# Patient Record
Sex: Female | Born: 1980 | Race: White | Hispanic: No | Smoking: Current every day smoker
Health system: Southern US, Community
[De-identification: ages and names within clinical notes are randomized; demographics above are authoritative.]

## PROBLEM LIST (undated history)

## (undated) DIAGNOSIS — F4310 Post-traumatic stress disorder, unspecified: Secondary | ICD-10-CM

## (undated) DIAGNOSIS — M549 Dorsalgia, unspecified: Secondary | ICD-10-CM

## (undated) DIAGNOSIS — G43909 Migraine, unspecified, not intractable, without status migrainosus: Secondary | ICD-10-CM

## (undated) DIAGNOSIS — K589 Irritable bowel syndrome without diarrhea: Secondary | ICD-10-CM

## (undated) DIAGNOSIS — K219 Gastro-esophageal reflux disease without esophagitis: Secondary | ICD-10-CM

## (undated) DIAGNOSIS — G8929 Other chronic pain: Secondary | ICD-10-CM

## (undated) DIAGNOSIS — F319 Bipolar disorder, unspecified: Secondary | ICD-10-CM

## (undated) HISTORY — DX: Post-traumatic stress disorder, unspecified: F43.10

## (undated) HISTORY — PX: TUBAL LIGATION: SHX77

## (undated) HISTORY — DX: Bipolar disorder, unspecified: F31.9

## (undated) HISTORY — DX: Gastro-esophageal reflux disease without esophagitis: K21.9

## (undated) HISTORY — PX: MYRINGOTOMY WITH TUBE PLACEMENT: SHX5663

## (undated) HISTORY — DX: Migraine, unspecified, not intractable, without status migrainosus: G43.909

## (undated) HISTORY — PX: CERVICAL CONE BIOPSY: SUR198

## (undated) HISTORY — DX: Irritable bowel syndrome, unspecified: K58.9

---

## 2015-03-07 ENCOUNTER — Encounter: Payer: Self-pay | Admitting: Internal Medicine

## 2015-03-22 ENCOUNTER — Telehealth: Payer: Self-pay | Admitting: Nurse Practitioner

## 2015-03-22 ENCOUNTER — Ambulatory Visit: Payer: Self-pay | Admitting: Nurse Practitioner

## 2015-03-22 ENCOUNTER — Encounter: Payer: Self-pay | Admitting: Nurse Practitioner

## 2015-03-22 NOTE — Telephone Encounter (Signed)
PATIENT WAS A NO SHOW AND LETTER SENT  °

## 2015-03-24 NOTE — Telephone Encounter (Signed)
Noted  

## 2015-03-28 ENCOUNTER — Encounter: Payer: Self-pay | Admitting: Nurse Practitioner

## 2015-03-28 ENCOUNTER — Ambulatory Visit (INDEPENDENT_AMBULATORY_CARE_PROVIDER_SITE_OTHER): Payer: Medicaid - Out of State | Admitting: Nurse Practitioner

## 2015-03-28 ENCOUNTER — Other Ambulatory Visit: Payer: Self-pay

## 2015-03-28 ENCOUNTER — Ambulatory Visit (HOSPITAL_COMMUNITY)
Admission: RE | Admit: 2015-03-28 | Discharge: 2015-03-28 | Disposition: A | Discharge status: Home / Self Care | Payer: Medicaid - Out of State | Source: Ambulatory Visit | Attending: Nurse Practitioner | Admitting: Nurse Practitioner

## 2015-03-28 VITALS — BP 135/90 | HR 93 | Temp 97.1°F | Ht 66.0 in | Wt 162.6 lb

## 2015-03-28 DIAGNOSIS — K5900 Constipation, unspecified: Secondary | ICD-10-CM

## 2015-03-28 DIAGNOSIS — K625 Hemorrhage of anus and rectum: Secondary | ICD-10-CM | POA: Diagnosis not present

## 2015-03-28 DIAGNOSIS — R1310 Dysphagia, unspecified: Secondary | ICD-10-CM | POA: Diagnosis not present

## 2015-03-28 DIAGNOSIS — K219 Gastro-esophageal reflux disease without esophagitis: Secondary | ICD-10-CM

## 2015-03-28 DIAGNOSIS — K921 Melena: Secondary | ICD-10-CM | POA: Diagnosis not present

## 2015-03-28 HISTORY — PX: OTHER SURGICAL HISTORY: SHX169

## 2015-03-28 LAB — CBC WITH DIFFERENTIAL/PLATELET
BASOS PCT: 0 % (ref 0–1)
Basophils Absolute: 0 10*3/uL (ref 0.0–0.1)
EOS PCT: 2 % (ref 0–5)
Eosinophils Absolute: 0.2 10*3/uL (ref 0.0–0.7)
HEMATOCRIT: 34.9 % — AB (ref 36.0–46.0)
Hemoglobin: 11.9 g/dL — ABNORMAL LOW (ref 12.0–15.0)
LYMPHS PCT: 22 % (ref 12–46)
Lymphs Abs: 1.8 10*3/uL (ref 0.7–4.0)
MCH: 29.6 pg (ref 26.0–34.0)
MCHC: 34.1 g/dL (ref 30.0–36.0)
MCV: 86.8 fL (ref 78.0–100.0)
MONO ABS: 0.7 10*3/uL (ref 0.1–1.0)
MPV: 10.9 fL (ref 8.6–12.4)
Monocytes Relative: 8 % (ref 3–12)
Neutro Abs: 5.6 10*3/uL (ref 1.7–7.7)
Neutrophils Relative %: 68 % (ref 43–77)
Platelets: 210 10*3/uL (ref 150–400)
RBC: 4.02 MIL/uL (ref 3.87–5.11)
RDW: 13.6 % (ref 11.5–15.5)
WBC: 8.2 10*3/uL (ref 4.0–10.5)

## 2015-03-28 LAB — COMPREHENSIVE METABOLIC PANEL
ALT: 7 U/L (ref 6–29)
AST: 13 U/L (ref 10–30)
Albumin: 3.6 g/dL (ref 3.6–5.1)
Alkaline Phosphatase: 79 U/L (ref 33–115)
BILIRUBIN TOTAL: 0.3 mg/dL (ref 0.2–1.2)
BUN: 4 mg/dL — ABNORMAL LOW (ref 7–25)
CALCIUM: 9 mg/dL (ref 8.6–10.2)
CHLORIDE: 105 mmol/L (ref 98–110)
CO2: 25 mmol/L (ref 20–31)
Creat: 0.82 mg/dL (ref 0.50–1.10)
GLUCOSE: 78 mg/dL (ref 65–99)
Potassium: 3.5 mmol/L (ref 3.5–5.3)
Sodium: 138 mmol/L (ref 135–146)
Total Protein: 6.2 g/dL (ref 6.1–8.1)

## 2015-03-28 MED ORDER — PEG 3350-KCL-NA BICARB-NACL 420 G PO SOLR: 4000.0000 mL | Freq: Once | ORAL | Status: DC

## 2015-03-28 NOTE — Assessment & Plan Note (Signed)
Patient states she has constipation with only a bowel movement every 3-4 days. However, her stools do seem to be liquid. Possible overflow diarrhea around constipation. Today we'll I'll order an abdominal x-ray to look for excessive stool burden. Colonoscopy as noted below also allow further evaluation for etiology. Return for follow-up in 2 months.

## 2015-03-28 NOTE — Assessment & Plan Note (Signed)
Patient with rectal bleeding about once a week noted on the stool and in the toilet. Also with generalized mild abdominal pain. Last colonoscopy per the patient was in [facility] in 2009. Given rectal bleeding and a years since last colonoscopy plan to proceed with colonoscopy in addition to her needed endoscopy to further evaluate. Return for follow-up in 2 months.

## 2015-03-28 NOTE — Patient Instructions (Addendum)
1. Have your x-ray and labs done when you're able to. 2. We will schedule your procedure for you. 3. Return for follow-up in 2 months.

## 2015-03-28 NOTE — Progress Notes (Addendum)
Primary Care Physician:  Ardyth Man, MD Primary Gastroenterologist:  Dr. Jena Gauss  Chief Complaint  Patient presents with  . Rectal Bleeding  . Dysphagia    HPI:   35 year old female referred by PCP for GERD and consideration of endoscopy. PCP notes reviewed. Last saw PCP on 02/22/2015 for "feels like something is going to burst, hiccups, really bad pressure." Question need for possible endoscopy. Appears to have been started on Dexilant 60 mg a day as she was taking omeprazole previously.  Today she states she had never had GERD before 3-4 months ago, begun abruptly. Was initially started on omeprazole which helped some but she began having worsening symptoms and was switched to Dexilant which did not work any better. Carafate tabs have helped some. Hasn't had recurrence since starting Carafate twice a day. Is having dysphagia symptoms daily, has to drink lots of fluids to get food to pass. Has some generalized abdominal pain described as dull, intermittent, and is 5/10 when it happens. No improvement with bowel movement. Also with increased nausea and vomiting, last episode of emesis about 1 month ago. Has hematochezia about once a week in the stool which has been occurring "for years." Was previously seen by GI with EGD and TCS at [facility] about 2009. Told she has IBS. Has diarrhea all the time. Admits black tarry stools with about 2 out of every 5 bowel movements. Has a bowel movement every 3-4 days, loose stools, occasional straining. Also admits chest pain and dyspnea which she hasn't told her PCP about. Has been going on since ER 1 month ago for chest pain and dx of GERD/esophagitis, given GI cocktail and PCP follow-up. Denies dizziness, lightheadedness, syncope, near syncope. Denies any other upper or lower GI symptoms.   Past Medical History  Diagnosis Date  . GERD (gastroesophageal reflux disease)   . PTSD (post-traumatic stress disorder)   . IBS (irritable bowel syndrome)   .  Migraines   . Bipolar disorder (HCC)   . Chronic back pain     Past Surgical History  Procedure Laterality Date  . None to date  03/28/15  . Myringotomy with tube placement    . Cervical cone biopsy      Current Outpatient Prescriptions  Medication Sig Dispense Refill  . buPROPion (WELLBUTRIN SR) 150 MG 12 hr tablet Take by mouth daily. Take 3 tabs every morning.    . cetirizine (ZYRTEC) 10 MG tablet Take 20 mg by mouth daily.    . clonazePAM (KLONOPIN) 1 MG tablet Take 3 mg by mouth daily.    . fluticasone (FLONASE) 50 MCG/ACT nasal spray Place 1 spray into both nostrils daily.    Marland Kitchen loperamide (IMODIUM) 2 MG capsule Take 2 mg by mouth as needed for diarrhea or loose stools.    Marland Kitchen omeprazole (PRILOSEC) 20 MG capsule Take 20 mg by mouth daily.    . pramipexole (MIRAPEX) 0.5 MG tablet Take 0.5 mg by mouth at bedtime.    . promethazine (PHENERGAN) 25 MG tablet Take 25 mg by mouth 3 (three) times daily as needed for nausea or vomiting.    . propranolol (INDERAL) 60 MG tablet Take 60 mg by mouth 2 (two) times daily.    . risperiDONE (RISPERDAL) 3 MG tablet Take 6 mg by mouth at bedtime.    . rizatriptan (MAXALT) 10 MG tablet Take 10 mg by mouth as needed for migraine. May repeat in 2 hours if needed    . traZODone (DESYREL) 100 MG tablet  Take 100-200 mg by mouth at bedtime.     . trihexyphenidyl (ARTANE) 2 MG tablet Take 2 mg by mouth at bedtime.    Marland Kitchen amitriptyline (ELAVIL) 10 MG tablet Take 10 mg by mouth at bedtime.    Marland Kitchen HYDROcodone-acetaminophen (NORCO/VICODIN) 5-325 MG tablet Take 1 tablet by mouth daily as needed for moderate pain.   0  . levonorgestrel (MIRENA) 20 MCG/24HR IUD 1 each by Intrauterine route once.    . polyethylene glycol-electrolytes (NULYTELY/GOLYTELY) 420 g solution Take 4,000 mLs by mouth once. 4000 mL 0  . sucralfate (CARAFATE) 1 g tablet Take 1 g by mouth daily.  0   No current facility-administered medications for this visit.    Allergies as of 03/28/2015 -  Review Complete 03/28/2015  Allergen Reaction Noted  . Levsin [hyoscyamine] Other (See Comments) 03/28/2015    Family History  Problem Relation Age of Onset  . Colon cancer Neg Hx     Social History   Social History  . Marital Status: Legally Separated    Spouse Name: N/A  . Number of Children: N/A  . Years of Education: N/A   Occupational History  . Not on file.   Social History Main Topics  . Smoking status: Current Every Day Smoker -- 0.50 packs/day for 17 years    Types: Cigarettes  . Smokeless tobacco: Never Used  . Alcohol Use: No  . Drug Use: Yes    Special: Marijuana     Comment: last used feb 1.  . Sexual Activity: Yes    Birth Control/ Protection: IUD   Other Topics Concern  . Not on file   Social History Narrative    Review of Systems: General: Negative for anorexia, weight loss, fever, chills, fatigue, weakness. Eyes: Negative for vision changes.  ENT: Negative for hoarseness. CV: Negative for chest pain, angina, palpitations, peripheral edema.  Respiratory: Negative for dyspnea at rest, cough, sputum, wheezing.  GI: See history of present illness. Derm: Negative for rash or itching.  Endo: Negative for unusual weight change.  Heme: Negative for bruising or bleeding. Allergy: Negative for rash or hives.    Physical Exam: BP 135/90 mmHg  Pulse 93  Temp(Src) 97.1 F (36.2 C) (Oral)  Ht  (1.676 m)  Wt 162 lb 9.6 oz (73.755 kg)  BMI 26.26 kg/m2  LMP  (LMP Unknown) General:   Alert and oriented. Pleasant and cooperative. Well-nourished and well-developed.  Head:  Normocephalic and atraumatic. Eyes:  Without icterus, sclera clear and conjunctiva pink.  Ears:  Normal auditory acuity. Cardiovascular:  S1, S2 present without murmurs appreciated. Extremities without clubbing or edema. Respiratory:  Clear to auscultation bilaterally. No wheezes, rales, or rhonchi. No distress.  Gastrointestinal:  +BS, soft, and non-distended. No HSM noted. No  guarding or rebound. No masses appreciated. Minimal generalized abdominal TTP. Rectal:  Deferred  Musculoskalatal:  Symmetrical without gross deformities. Normal posture. Skin:  Intact without significant lesions or rashes. Neurologic:  Alert and oriented x4;  grossly normal neurologically. Psych:  Alert and cooperative. Normal mood and affect. Heme/Lymph/Immune: No excessive bruising noted.    04/22/2015 3:55 PM

## 2015-03-28 NOTE — Assessment & Plan Note (Signed)
Patient with daily dysphagia symptoms in addition to her GERD symptoms as noted below. No regurgitation. Last EGD in 2009 at [place]. We'll request these records. At this point we'll proceed with endoscopy in addition to her colonoscopy as noted below. Return for follow-up in 2 months.

## 2015-03-28 NOTE — Assessment & Plan Note (Addendum)
Patient with new onset GERD 3-4 months ago with an abrupt start. Omeprazole helped some but then symptoms worsened. Dexilant did not seem to work any better. Is currently taking Carafate which helps somewhat. Has been seen in the emergency room at [facility] for the same which is where she received Carafate. At this point recommend she continue Dexilant daily, Carafate 2-3 times a day as needed. We'll also proceed with endoscopy in addition to colonoscopy for rectal bleeding.  Proceed with TCS and EGD with Dr. Jena Gauss in near future: the risks, benefits, and alternatives have been discussed with the patient in detail. The patient states understanding and desires to proceed.  The patient is on trazodone, risperidone, Phenergan, Christiane Ha, and Wellbutrin. Given polypharmacy we'll plan for the procedure and the OR on propofol/MAC to promote adequate sedation.

## 2015-03-28 NOTE — Progress Notes (Signed)
CC'D TO PCP °

## 2015-04-15 NOTE — Patient Instructions (Signed)
Kristine Vaughn  04/15/2015     @   Your procedure is scheduled on  04/25/2015   Report to [facility] at  845  A.M.  Call this number if you have problems the morning of surgery:  [phone]   Remember:  Do not eat food or drink liquids after midnight.  Take these medicines the morning of surgery with A SIP OF WATER  Wellbutrin, zyrtec, klonopin, hydrocodone, prilosec, phenergan, propranolol, maxalt.   Do not wear jewelry, make-up or nail polish.  Do not wear lotions, powders, or perfumes.  You may wear deodorant.  Do not shave 48 hours prior to surgery.  Men may shave face and neck.  Do not bring valuables to the hospital.  [facility] is not responsible for any belongings or valuables.  Contacts, dentures or bridgework may not be worn into surgery.  Leave your suitcase in the car.  After surgery it may be brought to your room.  For patients admitted to the hospital, discharge time will be determined by your treatment team.  Patients discharged the day of surgery will not be allowed to drive home.   Name and phone number of your driver:   family Special instructions:  Follow the diet and prep instructions given to you by Dr Luvenia Starch office.  Please read over the following fact sheets that you were given. Coughing and Deep Breathing, Surgical Site Infection Prevention, Anesthesia Post-op Instructions and Care and Recovery After Surgery      Esophagogastroduodenoscopy Esophagogastroduodenoscopy (EGD) is a procedure that is used to examine the lining of the esophagus, stomach, and first part of the small intestine (duodenum). A long, flexible, lighted tube with a camera attached (endoscope) is inserted down the throat to view these organs. This procedure is done to detect problems or abnormalities, such as inflammation, bleeding, ulcers, or growths, in order to treat them. The procedure lasts 5-20 minutes. It is usually an outpatient  procedure, but it may need to be performed in a hospital in emergency cases. LET [facility] CARE PROVIDER KNOW ABOUT:  Any allergies you have.  All medicines you are taking, including vitamins, herbs, eye drops, creams, and over-the-counter medicines.  Previous problems you or members of your family have had with the use of anesthetics.  Any blood disorders you have.  Previous surgeries you have had.  Medical conditions you have. RISKS AND COMPLICATIONS Generally, this is a safe procedure. However, problems can occur and include:  Infection.  Bleeding.  Tearing (perforation) of the esophagus, stomach, or duodenum.  Difficulty breathing or not being able to breathe.  Excessive sweating.  Spasms of the larynx.  Slowed heartbeat.  Low blood pressure. BEFORE THE PROCEDURE  Do not eat or drink anything after midnight on the night before the procedure or as directed by your health care provider.  Do not take your regular medicines before the procedure if your health care provider asks you not to. Ask your health care provider about changing or stopping those medicines.  If you wear dentures, be prepared to remove them before the procedure.  Arrange for someone to drive you home after the procedure. PROCEDURE  A numbing medicine (local anesthetic) may be sprayed in your throat for comfort and to stop you from gagging or coughing.  You will have an IV tube inserted in a vein in your hand or arm. You will receive medicines and fluids through this tube.  You will be given a medicine to relax you (sedative).  A pain reliever will be given through the IV tube.  A mouth guard may be placed in your mouth to protect your teeth and to keep you from biting on the endoscope.  You will be asked to lie on your left side.  The endoscope will be inserted down your throat and into your esophagus, stomach, and duodenum.  Air will be put through the endoscope to allow your health  care provider to clearly view the lining of your esophagus.  The lining of your esophagus, stomach, and duodenum will be examined. During the exam, your health care provider may:  Remove tissue to be examined under a microscope (biopsy) for inflammation, infection, or other medical problems.  Remove growths.  Remove objects (foreign bodies) that are stuck.  Treat any bleeding with medicines or other devices that stop tissues from bleeding (hot cautery, clipping devices).  Widen (dilate) or stretch narrowed areas of your esophagus and stomach.  The endoscope will be withdrawn. AFTER THE PROCEDURE  You will be taken to a recovery area for observation. Your blood pressure, heart rate, breathing rate, and blood oxygen level will be monitored often until the medicines you were given have worn off.  Do not eat or drink anything until the numbing medicine has worn off and your gag reflex has returned. You may choke.  Your health care provider should be able to discuss his or her findings with you. It will take longer to discuss the test results if any biopsies were taken.   This information is not intended to replace advice given to you by your health care provider. Make sure you discuss any questions you have with your health care provider.   Document Released: 06/15/2004 Document Revised: 03/05/2014 Document Reviewed: 01/16/2012 Elsevier Interactive Patient Education 2016 [employer] Esophagogastroduodenoscopy, Care After Refer to this sheet in the next few weeks. These instructions provide you with information about caring for yourself after your procedure. Your health care provider may also give you more specific instructions. Your treatment has been planned according to current medical practices, but problems sometimes occur. Call your health care provider if you have any problems or questions after your procedure. WHAT TO EXPECT AFTER THE PROCEDURE After your procedure, it is typical  to feel:  Soreness in your throat.  Pain with swallowing.  Sick to your stomach (nauseous).  Bloated.  Dizzy.  Fatigued. HOME CARE INSTRUCTIONS  Do not eat or drink anything until the numbing medicine (local anesthetic) has worn off and your gag reflex has returned. You will know that the local anesthetic has worn off when you can swallow comfortably.  Do not drive or operate machinery until directed by your health care provider.  Take medicines only as directed by your health care provider. SEEK MEDICAL CARE IF:   You cannot stop coughing.  You are not urinating at all or less than usual. SEEK IMMEDIATE MEDICAL CARE IF:  You have difficulty swallowing.  You cannot eat or drink.  You have worsening throat or chest pain.  You have dizziness or lightheadedness or you faint.  You have nausea or vomiting.  You have chills.  You have a fever.  You have severe abdominal pain.  You have black, tarry, or bloody stools.   This information is not intended to replace advice given to you by your health care provider. Make sure you discuss any questions you have with your health care provider.  Document Released: 01/30/2012 Document Revised: 03/05/2014 Document Reviewed: 01/30/2012 Elsevier Interactive Patient Education 2016 [employer]. Colonoscopy A colonoscopy is an exam to look at the entire large intestine (colon). This exam can help find problems such as tumors, polyps, inflammation, and areas of bleeding. The exam takes about 1 hour.  LET [place] CARE PROVIDER KNOW ABOUT:   Any allergies you have.  All medicines you are taking, including vitamins, herbs, eye drops, creams, and over-the-counter medicines.  Previous problems you or members of your family have had with the use of anesthetics.  Any blood disorders you have.  Previous surgeries you have had.  Medical conditions you have. RISKS AND COMPLICATIONS  Generally, this is a safe procedure.  However, as with any procedure, complications can occur. Possible complications include:  Bleeding.  Tearing or rupture of the colon wall.  Reaction to medicines given during the exam.  Infection (rare). BEFORE THE PROCEDURE   Ask your health care provider about changing or stopping your regular medicines.  You may be prescribed an oral bowel prep. This involves drinking a large amount of medicated liquid, starting the day before your procedure. The liquid will cause you to have multiple loose stools until your stool is almost clear or light green. This cleans out your colon in preparation for the procedure.  Do not eat or drink anything else once you have started the bowel prep, unless your health care provider tells you it is safe to do so.  Arrange for someone to drive you home after the procedure. PROCEDURE   You will be given medicine to help you relax (sedative).  You will lie on your side with your knees bent.  A long, flexible tube with a light and camera on the end (colonoscope) will be inserted through the rectum and into the colon. The camera sends video back to a computer screen as it moves through the colon. The colonoscope also releases carbon dioxide gas to inflate the colon. This helps your health care provider see the area better.  During the exam, your health care provider may take a small tissue sample (biopsy) to be examined under a microscope if any abnormalities are found.  The exam is finished when the entire colon has been viewed. AFTER THE PROCEDURE   Do not drive for 24 hours after the exam.  You may have a small amount of blood in your stool.  You may pass moderate amounts of gas and have mild abdominal cramping or bloating. This is caused by the gas used to inflate your colon during the exam.  Ask when your test results will be ready and how you will get your results. Make sure you get your test results.   This information is not intended to replace  advice given to you by your health care provider. Make sure you discuss any questions you have with your health care provider.   Document Released: 02/10/2000 Document Revised: 12/03/2012 Document Reviewed: 10/20/2012 Elsevier Interactive Patient Education 2016 [employer]. Colonoscopy, Care After Refer to this sheet in the next few weeks. These instructions provide you with information on caring for yourself after your procedure. Your health care provider may also give you more specific instructions. Your treatment has been planned according to current medical practices, but problems sometimes occur. Call your health care provider if you have any problems or questions after your procedure. WHAT TO EXPECT AFTER THE PROCEDURE  After your procedure, it is typical to have the following:  A small  amount of blood in your stool.  Moderate amounts of gas and mild abdominal cramping or bloating. HOME CARE INSTRUCTIONS  Do not drive, operate machinery, or sign important documents for 24 hours.  You may shower and resume your regular physical activities, but move at a slower pace for the first 24 hours.  Take frequent rest periods for the first 24 hours.  Walk around or put a warm pack on your abdomen to help reduce abdominal cramping and bloating.  Drink enough fluids to keep your urine clear or pale yellow.  You may resume your normal diet as instructed by your health care provider. Avoid heavy or fried foods that are hard to digest.  Avoid drinking alcohol for 24 hours or as instructed by your health care provider.  Only take over-the-counter or prescription medicines as directed by your health care provider.  If a tissue sample (biopsy) was taken during your procedure:  Do not take aspirin or blood thinners for 7 days, or as instructed by your health care provider.  Do not drink alcohol for 7 days, or as instructed by your health care provider.  Eat soft foods for the first 24  hours. SEEK MEDICAL CARE IF: You have persistent spotting of blood in your stool 2-3 days after the procedure. SEEK IMMEDIATE MEDICAL CARE IF:  You have more than a small spotting of blood in your stool.  You pass large blood clots in your stool.  Your abdomen is swollen (distended).  You have nausea or vomiting.  You have a fever.  You have increasing abdominal pain that is not relieved with medicine.   This information is not intended to replace advice given to you by your health care provider. Make sure you discuss any questions you have with your health care provider.   Document Released: 09/27/2003 Document Revised: 12/03/2012 Document Reviewed: 10/20/2012 Elsevier Interactive Patient Education 2016 Elsevier Inc. PATIENT INSTRUCTIONS POST-ANESTHESIA  IMMEDIATELY FOLLOWING SURGERY:  Do not drive or operate machinery for the first twenty four hours after surgery.  Do not make any important decisions for twenty four hours after surgery or while taking narcotic pain medications or sedatives.  If you develop intractable nausea and vomiting or a severe headache please notify your doctor immediately.  FOLLOW-UP:  Please make an appointment with your surgeon as instructed. You do not need to follow up with anesthesia unless specifically instructed to do so.  WOUND CARE INSTRUCTIONS (if applicable):  Keep a dry clean dressing on the anesthesia/puncture wound site if there is drainage.  Once the wound has quit draining you may leave it open to air.  Generally you should leave the bandage intact for twenty four hours unless there is drainage.  If the epidural site drains for more than 36-48 hours please call the anesthesia department.  QUESTIONS?:  Please feel free to call your physician or the hospital operator if you have any questions, and they will be happy to assist you.

## 2015-04-19 ENCOUNTER — Encounter (HOSPITAL_COMMUNITY): Payer: Self-pay

## 2015-04-19 ENCOUNTER — Encounter (HOSPITAL_COMMUNITY)
Admission: RE | Admit: 2015-04-19 | Discharge: 2015-04-19 | Disposition: A | Discharge status: Home / Self Care | Payer: Medicaid - Out of State | Source: Ambulatory Visit | Attending: Internal Medicine | Admitting: Internal Medicine

## 2015-04-19 ENCOUNTER — Other Ambulatory Visit: Payer: Self-pay

## 2015-04-19 DIAGNOSIS — Z01810 Encounter for preprocedural cardiovascular examination: Secondary | ICD-10-CM | POA: Insufficient documentation

## 2015-04-19 DIAGNOSIS — K625 Hemorrhage of anus and rectum: Secondary | ICD-10-CM | POA: Diagnosis not present

## 2015-04-19 DIAGNOSIS — K5900 Constipation, unspecified: Secondary | ICD-10-CM | POA: Diagnosis not present

## 2015-04-19 DIAGNOSIS — R1310 Dysphagia, unspecified: Secondary | ICD-10-CM | POA: Diagnosis not present

## 2015-04-19 DIAGNOSIS — K219 Gastro-esophageal reflux disease without esophagitis: Secondary | ICD-10-CM | POA: Diagnosis not present

## 2015-04-19 HISTORY — DX: Dorsalgia, unspecified: M54.9

## 2015-04-19 HISTORY — DX: Other chronic pain: G89.29

## 2015-04-19 NOTE — Pre-Procedure Instructions (Signed)
Patient given information to sign up for my chart at home. 

## 2015-04-25 ENCOUNTER — Ambulatory Visit (HOSPITAL_COMMUNITY): Payer: Medicaid - Out of State | Admitting: Anesthesiology

## 2015-04-25 ENCOUNTER — Encounter (HOSPITAL_COMMUNITY)
Admission: RE | Disposition: A | Discharge status: Home / Self Care | Payer: Self-pay | Source: Ambulatory Visit | Attending: Internal Medicine

## 2015-04-25 ENCOUNTER — Ambulatory Visit (HOSPITAL_COMMUNITY)
Admission: RE | Admit: 2015-04-25 | Discharge: 2015-04-25 | Disposition: A | Discharge status: Home / Self Care | Payer: Medicaid - Out of State | Source: Ambulatory Visit | Attending: Internal Medicine | Admitting: Internal Medicine

## 2015-04-25 ENCOUNTER — Encounter (HOSPITAL_COMMUNITY): Payer: Self-pay | Admitting: Anesthesiology

## 2015-04-25 DIAGNOSIS — Z79899 Other long term (current) drug therapy: Secondary | ICD-10-CM | POA: Insufficient documentation

## 2015-04-25 DIAGNOSIS — K2970 Gastritis, unspecified, without bleeding: Secondary | ICD-10-CM | POA: Insufficient documentation

## 2015-04-25 DIAGNOSIS — R1310 Dysphagia, unspecified: Secondary | ICD-10-CM

## 2015-04-25 DIAGNOSIS — K921 Melena: Secondary | ICD-10-CM | POA: Diagnosis not present

## 2015-04-25 DIAGNOSIS — F4310 Post-traumatic stress disorder, unspecified: Secondary | ICD-10-CM | POA: Diagnosis not present

## 2015-04-25 DIAGNOSIS — K319 Disease of stomach and duodenum, unspecified: Secondary | ICD-10-CM | POA: Insufficient documentation

## 2015-04-25 DIAGNOSIS — K2100 Gastro-esophageal reflux disease with esophagitis, without bleeding: Secondary | ICD-10-CM | POA: Insufficient documentation

## 2015-04-25 DIAGNOSIS — R1013 Epigastric pain: Secondary | ICD-10-CM | POA: Insufficient documentation

## 2015-04-25 DIAGNOSIS — K259 Gastric ulcer, unspecified as acute or chronic, without hemorrhage or perforation: Secondary | ICD-10-CM | POA: Insufficient documentation

## 2015-04-25 DIAGNOSIS — F17210 Nicotine dependence, cigarettes, uncomplicated: Secondary | ICD-10-CM | POA: Diagnosis not present

## 2015-04-25 DIAGNOSIS — K644 Residual hemorrhoidal skin tags: Secondary | ICD-10-CM | POA: Insufficient documentation

## 2015-04-25 DIAGNOSIS — K648 Other hemorrhoids: Secondary | ICD-10-CM | POA: Diagnosis not present

## 2015-04-25 DIAGNOSIS — F319 Bipolar disorder, unspecified: Secondary | ICD-10-CM | POA: Insufficient documentation

## 2015-04-25 DIAGNOSIS — K3189 Other diseases of stomach and duodenum: Secondary | ICD-10-CM | POA: Diagnosis not present

## 2015-04-25 HISTORY — PX: COLONOSCOPY WITH PROPOFOL: SHX5780

## 2015-04-25 HISTORY — PX: ESOPHAGOGASTRODUODENOSCOPY (EGD) WITH PROPOFOL: SHX5813

## 2015-04-25 HISTORY — PX: BIOPSY: SHX5522

## 2015-04-25 HISTORY — PX: ESOPHAGEAL DILATION: SHX303

## 2015-04-25 LAB — PREGNANCY, URINE: Preg Test, Ur: NEGATIVE

## 2015-04-25 SURGERY — COLONOSCOPY WITH PROPOFOL
Anesthesia: Monitor Anesthesia Care

## 2015-04-25 MED ORDER — LIDOCAINE HCL (PF) 1 % IJ SOLN
INTRAMUSCULAR | Status: AC
  Filled 2015-04-25: qty 5

## 2015-04-25 MED ORDER — ONDANSETRON HCL 4 MG/2ML IJ SOLN
INTRAMUSCULAR | Status: AC
  Filled 2015-04-25: qty 2

## 2015-04-25 MED ORDER — FENTANYL CITRATE (PF) 100 MCG/2ML IJ SOLN: 25.0000 ug | INTRAMUSCULAR | Status: DC | PRN

## 2015-04-25 MED ORDER — MIDAZOLAM HCL 5 MG/5ML IJ SOLN
INTRAMUSCULAR | Status: DC | PRN
  Administered 2015-04-25: 2 mg via INTRAVENOUS

## 2015-04-25 MED ORDER — ONDANSETRON HCL 4 MG/2ML IJ SOLN
4.0000 mg | Freq: Once | INTRAMUSCULAR | Status: AC
  Administered 2015-04-25: 4 mg via INTRAVENOUS

## 2015-04-25 MED ORDER — PROPOFOL 10 MG/ML IV BOLUS
INTRAVENOUS | Status: AC
  Filled 2015-04-25: qty 40

## 2015-04-25 MED ORDER — LIDOCAINE HCL (CARDIAC) 10 MG/ML IV SOLN
INTRAVENOUS | Status: DC | PRN
  Administered 2015-04-25: 50 mg via INTRAVENOUS

## 2015-04-25 MED ORDER — GLYCOPYRROLATE 0.2 MG/ML IJ SOLN
INTRAMUSCULAR | Status: AC
  Filled 2015-04-25: qty 1

## 2015-04-25 MED ORDER — MIDAZOLAM HCL 2 MG/2ML IJ SOLN
INTRAMUSCULAR | Status: AC
  Filled 2015-04-25: qty 2

## 2015-04-25 MED ORDER — FENTANYL CITRATE (PF) 100 MCG/2ML IJ SOLN
INTRAMUSCULAR | Status: AC
  Filled 2015-04-25: qty 2

## 2015-04-25 MED ORDER — PROPOFOL 500 MG/50ML IV EMUL
INTRAVENOUS | Status: DC | PRN
  Administered 2015-04-25: 150 ug/kg/min via INTRAVENOUS
  Administered 2015-04-25: 10:00:00 via INTRAVENOUS

## 2015-04-25 MED ORDER — LIDOCAINE VISCOUS 2 % MT SOLN
OROMUCOSAL | Status: AC
  Filled 2015-04-25: qty 15

## 2015-04-25 MED ORDER — LACTATED RINGERS IV SOLN
INTRAVENOUS | Status: DC
  Administered 2015-04-25: 10:00:00 via INTRAVENOUS

## 2015-04-25 MED ORDER — ONDANSETRON HCL 4 MG/2ML IJ SOLN: 4.0000 mg | Freq: Once | INTRAMUSCULAR | Status: DC | PRN

## 2015-04-25 MED ORDER — MIDAZOLAM HCL 2 MG/2ML IJ SOLN
1.0000 mg | INTRAMUSCULAR | Status: DC | PRN
  Administered 2015-04-25: 2 mg via INTRAVENOUS

## 2015-04-25 MED ORDER — FENTANYL CITRATE (PF) 100 MCG/2ML IJ SOLN
25.0000 ug | INTRAMUSCULAR | Status: AC
  Administered 2015-04-25 (×2): 25 ug via INTRAVENOUS

## 2015-04-25 MED ORDER — LIDOCAINE VISCOUS 2 % MT SOLN
5.0000 mL | Freq: Once | OROMUCOSAL | Status: AC
  Administered 2015-04-25: 5 mL via OROMUCOSAL

## 2015-04-25 NOTE — Anesthesia Procedure Notes (Signed)
Procedure Name: MAC Date/Time: 04/25/2015 9:53 AM Performed by: Pernell Dupre, Greydis Stlouis A Pre-anesthesia Checklist: Patient identified, Emergency Drugs available, Suction available, Timeout performed and Patient being monitored Oxygen Delivery Method: Simple face mask

## 2015-04-25 NOTE — Op Note (Signed)
[facility]: [place] Rhonda [address] [place], [address]   ENDOSCOPY PROCEDURE REPORT  PATIENT: Kristine Vaughn, Kristine Vaughn  MR#: [id number] BIRTHDATE: 1980-04-22 , 34  yrs. old GENDER: female ENDOSCOPIST: R.  Roetta Sessions, MD FACP FACG REFERRED BY:  Meredith Mody, M.D. PROCEDURE DATE:  02-May-2015 PROCEDURE:  EGD w/ biopsy and Maloney dilation of esophagus INDICATIONS:  esophageal dysphagia; GERD; epigastric pain. MEDICATIONS: deep sedation per Dr.  [facility] ASA CLASS:      Class II  CONSENT: The risks, benefits, limitations, alternatives and imponderables have been discussed.  The potential for biopsy, esophogeal dilation, etc. have also been reviewed.  Questions have been answered.  All parties agreeable.  Please see the history and physical in the medical record for more information.  DESCRIPTION OF PROCEDURE: After the risks benefits and alternatives of the procedure were thoroughly explained, informed consent was obtained.  The EG-2990i ([id number]) endoscope was introduced through the mouth and advanced to the second portion of the duodenum , limited by Without limitations. The instrument was slowly withdrawn as the mucosa was fully examined. Estimated blood loss is zero unless otherwise noted in this procedure report.    Circumferential distal esophageal erosions 5 mm junction.  No tumor seen.  No Barrett's epithelium seen..  Tubular esophagus appeared patent throughout its course.  Stomach empty. Multiple antral erosions present. No ulcer infiltrating process. Patent pylorus. Normal-appearing first and second portion of the duodenum.  Scope withdrawn; a 14 French Maloney dilator was passed to full insertion easily. A look back revealed no apparent complication related to this maneuver. Finally, biopsies of abnormal gastric mucosa taken. Retroflexed views revealed as previously described. The scope was then withdrawn from the patient and the  procedure completed.  COMPLICATIONS: There were no immediate complications. EBL 3 mL ENDOSCOPIC IMPRESSION: Erosive reflux esophagitis. Abnormal gastric mucosa?"status post biopsy  RECOMMENDATIONS: Further recommendations to follow pending review of pathology report. See colonoscopy report.  REPEAT EXAM:  eSigned:  R. Roetta Sessions, MD Jerrel Ivory [facility] May 02, 2015 10:19 AM    CC:  CPT CODES: ICD CODES:  The ICD and CPT codes recommended by this software are interpretations from the data that the clinical staff has captured with the software.  The verification of the translation of this report to the ICD and CPT codes and modifiers is the sole responsibility of the health care institution and practicing physician where this report was generated.  [employer] will not be held responsible for the validity of the ICD and CPT codes included on this report.  AMA assumes no liability for data contained or not contained herein. CPT is a Publishing rights manager of the [employer].  PATIENT NAME:  [name], [name] MR#: [id number]

## 2015-04-25 NOTE — Op Note (Signed)
[facility] [address] [place], [address]   COLONOSCOPY PROCEDURE REPORT  PATIENT: Kristine Vaughn, Kristine Vaughn  MR#: [id number] BIRTHDATE: 12/12/1980 , 34  yrs. old GENDER: female ENDOSCOPIST: R.  Roetta Sessions, MD FACP [facility] REFERRED XB:JYNW Park, M.D. PROCEDURE DATE:  05-20-15 PROCEDURE:   Ileo-colonoscopy, diagnostic INDICATIONS:hematochezia. MEDICATIONS: deep sedation per Dr.  [facility] ASA CLASS:       Class II  CONSENT: The risks, benefits, alternatives and imponderables including but not limited to bleeding, perforation as well as the possibility of a missed lesion have been reviewed.  The potential for biopsy, lesion removal, etc. have also been discussed. Questions have been answered.  All parties agreeable.  Please see the history and physical in the medical record for more information.  DESCRIPTION OF PROCEDURE:   After the risks benefits and alternatives of the procedure were thoroughly explained, informed consent was obtained.  The digital rectal exam revealed no abnormalities of the rectum.   The EG-2990i ([id number])  endoscope was introduced through the anus and advanced to the terminal ileum which was intubated for a short distance. No adverse events experienced.   The quality of the prep was adequate  The instrument was then slowly withdrawn as the colon was fully examined. Estimated blood loss is zero unless otherwise noted in this procedure report.      COLON FINDINGS: External hemorrhoid tags; normal-appearing rectal mucosa.  Normal-appearing colonic mucosa.  The distal 5 cm of terminal ileal mucosa also appeared normal.  Retroflexion was performed. .  Withdrawal time=7 minutes 0 seconds.  The scope was withdrawn and the procedure completed. COMPLICATIONS: There were no immediate complications.  ENDOSCOPIC IMPRESSION: External hemorrhoids; otherwise normal ileo-colonoscopy  RECOMMENDATIONS: Ten-day course of Anusol's  suppositories 1 per rectum twice daily. See EGD report.  eSigned:  R. Roetta Sessions, MD Jerrel Ivory [facility] 20-May-2015 10:58 AM   cc:  CPT CODES: ICD CODES:  The ICD and CPT codes recommended by this software are interpretations from the data that the clinical staff has captured with the software.  The verification of the translation of this report to the ICD and CPT codes and modifiers is the sole responsibility of the health care institution and practicing physician where this report was generated.  [employer] will not be held responsible for the validity of the ICD and CPT codes included on this report.  AMA assumes no liability for data contained or not contained herein. CPT is a Publishing rights manager of the [employer].  PATIENT NAME:  Kristine Vaughn, Kristine Vaughn MR#: [id number]

## 2015-04-25 NOTE — Transfer of Care (Signed)
Immediate Anesthesia Transfer of Care Note  Patient: Kristine Vaughn  Procedure(s) Performed: Procedure(s) with comments: COLONOSCOPY WITH PROPOFOL (N/A) - 0915 - moved to 10:00 - office to notify ESOPHAGOGASTRODUODENOSCOPY (EGD) WITH PROPOFOL (N/A) BIOPSY ESOPHAGEAL DILATION  Patient Location: PACU  Anesthesia Type:MAC  Level of Consciousness: awake, alert , oriented and patient cooperative  Airway & Oxygen Therapy: Patient Spontanous Breathing and Patient connected to face mask oxygen  Post-op Assessment: Report given to RN and Post -op Vital signs reviewed and stable  Post vital signs: Reviewed and stable  Last Vitals:  Filed Vitals:   04/25/15 0945 04/25/15 0950  BP: 92/58 93/57  Pulse: 80 80  Temp:    Resp: 18 18    Complications: No apparent anesthesia complications

## 2015-04-25 NOTE — Discharge Instructions (Signed)
Colonoscopy Discharge Instructions  Read the instructions outlined below and refer to this sheet in the next few weeks. These discharge instructions provide you with general information on caring for yourself after you leave the hospital. Your doctor may also give you specific instructions. While your treatment has been planned according to the most current medical practices available, unavoidable complications occasionally occur. If you have any problems or questions after discharge, call Dr. Gala Romney at [phone]. ACTIVITY  You may resume your regular activity, but move at a slower pace for the next 24 hours.   Take frequent rest periods for the next 24 hours.   Walking will help get rid of the air and reduce the bloated feeling in your belly (abdomen).   No driving for 24 hours (because of the medicine (anesthesia) used during the test).    Do not sign any important legal documents or operate any machinery for 24 hours (because of the anesthesia used during the test).  NUTRITION  Drink plenty of fluids.   You may resume your normal diet as instructed by your doctor.   Begin with a light meal and progress to your normal diet. Heavy or fried foods are harder to digest and may make you feel sick to your stomach (nauseated).   Avoid alcoholic beverages for 24 hours or as instructed.  MEDICATIONS  You may resume your normal medications unless your doctor tells you otherwise.  WHAT YOU CAN EXPECT TODAY  Some feelings of bloating in the abdomen.   Passage of more gas than usual.   Spotting of blood in your stool or on the toilet paper.  IF YOU HAD POLYPS REMOVED DURING THE COLONOSCOPY:  No aspirin products for 7 days or as instructed.   No alcohol for 7 days or as instructed.   Eat a soft diet for the next 24 hours.  FINDING OUT THE RESULTS OF YOUR TEST Not all test results are available during your visit. If your test results are not back during the visit, make an appointment  with your caregiver to find out the results. Do not assume everything is normal if you have not heard from your caregiver or the medical facility. It is important for you to follow up on all of your test results.  SEEK IMMEDIATE MEDICAL ATTENTION IF:  You have more than a spotting of blood in your stool.   Your belly is swollen (abdominal distention).   You are nauseated or vomiting.   You have a temperature over 101.  You have abdominal pain or discomfort that is severe or gets worse throughout the day. EGD Discharge instructions Please read the instructions outlined below and refer to this sheet in the next few weeks. These discharge instructions provide you with general information on caring for yourself after you leave the hospital. Your doctor may also give you specific instructions. While your treatment has been planned according to the most current medical practices available, unavoidable complications occasionally occur. If you have any problems or questions after discharge, please call your doctor. ACTIVITY You may resume your regular activity but move at a slower pace for the next 24 hours.  Take frequent rest periods for the next 24 hours.  Walking will help expel (get rid of) the air and reduce the bloated feeling in your abdomen.  No driving for 24 hours (because of the anesthesia (medicine) used during the test).  You may shower.  Do not sign any important legal documents or operate any machinery for 24  hours (because of the anesthesia used during the test).  NUTRITION Drink plenty of fluids.  You may resume your normal diet.  Begin with a light meal and progress to your normal diet.  Avoid alcoholic beverages for 24 hours or as instructed by your caregiver.  MEDICATIONS You may resume your normal medications unless your caregiver tells you otherwise.  WHAT YOU CAN EXPECT TODAY You may experience abdominal discomfort such as a feeling of fullness or gas pains.   FOLLOW-UP Your doctor will discuss the results of your test with you.  SEEK IMMEDIATE MEDICAL ATTENTION IF ANY OF THE FOLLOWING OCCUR: Excessive nausea (feeling sick to your stomach) and/or vomiting.  Severe abdominal pain and distention (swelling).  Trouble swallowing.  Temperature over 101 F (37.8 C).     Rectal bleeding or vomiting of blood.     GERD information and hemorrhoid information provided  Continue Dexilant 60 mg daily  Continue Carafate  Course of Anusol suppositories for hemorrhoids  Further recommendations to follow pending review of pathology report  Hemorrhoids Hemorrhoids are swollen veins around the rectum or anus. There are two types of hemorrhoids:   Internal hemorrhoids. These occur in the veins just inside the rectum. They may poke through to the outside and become irritated and painful.  External hemorrhoids. These occur in the veins outside the anus and can be felt as a painful swelling or hard lump near the anus. CAUSES  Pregnancy.   Obesity.   Constipation or diarrhea.   Straining to have a bowel movement.   Sitting for long periods on the toilet.  Heavy lifting or other activity that caused you to strain.  Anal intercourse. SYMPTOMS   Pain.   Anal itching or irritation.   Rectal bleeding.   Fecal leakage.   Anal swelling.   One or more lumps around the anus.  DIAGNOSIS  Your caregiver may be able to diagnose hemorrhoids by visual examination. Other examinations or tests that may be performed include:   Examination of the rectal area with a gloved hand (digital rectal exam).   Examination of anal canal using a small tube (scope).   A blood test if you have lost a significant amount of blood.  A test to look inside the colon (sigmoidoscopy or colonoscopy). TREATMENT Most hemorrhoids can be treated at home. However, if symptoms do not seem to be getting better or if you have a lot of rectal bleeding, your  caregiver may perform a procedure to help make the hemorrhoids get smaller or remove them completely. Possible treatments include:   Placing a rubber band at the base of the hemorrhoid to cut off the circulation (rubber band ligation).   Injecting a chemical to shrink the hemorrhoid (sclerotherapy).   Using a tool to burn the hemorrhoid (infrared light therapy).   Surgically removing the hemorrhoid (hemorrhoidectomy).   Stapling the hemorrhoid to block blood flow to the tissue (hemorrhoid stapling).  HOME CARE INSTRUCTIONS   Eat foods with fiber, such as whole grains, beans, nuts, fruits, and vegetables. Ask your doctor about taking products with added fiber in them (fibersupplements).  Increase fluid intake. Drink enough water and fluids to keep your urine clear or pale yellow.   Exercise regularly.   Go to the bathroom when you have the urge to have a bowel movement. Do not wait.   Avoid straining to have bowel movements.   Keep the anal area dry and clean. Use wet toilet paper or moist towelettes after a bowel  movement.   Medicated creams and suppositories may be used or applied as directed.   Only take over-the-counter or prescription medicines as directed by your caregiver.   Take warm sitz baths for 15-20 minutes, 3-4 times a day to ease pain and discomfort.   Place ice packs on the hemorrhoids if they are tender and swollen. Using ice packs between sitz baths may be helpful.   Put ice in a plastic bag.   Place a towel between your skin and the bag.   Leave the ice on for 15-20 minutes, 3-4 times a day.   Do not use a donut-shaped pillow or sit on the toilet for long periods. This increases blood pooling and pain.  SEEK MEDICAL CARE IF:  You have increasing pain and swelling that is not controlled by treatment or medicine.  You have uncontrolled bleeding.  You have difficulty or you are unable to have a bowel movement.  You have pain or  inflammation outside the area of the hemorrhoids. MAKE SURE YOU:  Understand these instructions.  Will watch your condition.  Will get help right away if you are not doing well or get worse.   This information is not intended to replace advice given to you by your health care provider. Make sure you discuss any questions you have with your health care provider.   Document Released: 02/10/2000 Document Revised: 01/30/2012 Document Reviewed: 12/18/2011 Elsevier Interactive Patient Education 2016 [employer] Inc[employer]   Gastroesophageal Reflux Disease, Adult Normally, food travels down the esophagus and stays in the stomach to be digested. However, when a person has gastroesophageal reflux disease (GERD), food and stomach acid move back up into the esophagus. When this happens, the esophagus becomes sore and inflamed. Over time, GERD can create small holes (ulcers) in the lining of the esophagus.  CAUSES This condition is caused by a problem with the muscle between the esophagus and the stomach (lower esophageal sphincter, or LES). Normally, the LES muscle closes after food passes through the esophagus to the stomach. When the LES is weakened or abnormal, it does not close properly, and that allows food and stomach acid to go back up into the esophagus. The LES can be weakened by certain dietary substances, medicines, and medical conditions, including:  Tobacco use.  Pregnancy.  Having a hiatal hernia.  Heavy alcohol use.  Certain foods and beverages, such as coffee, chocolate, onions, and peppermint. RISK FACTORS This condition is more likely to develop in:  People who have an increased body weight.  People who have connective tissue disorders.  People who use NSAID medicines. SYMPTOMS Symptoms of this condition include:  Heartburn.  Difficult or painful swallowing.  The feeling of having a lump in the throat.  Abitter taste in the mouth.  Bad breath.  Having a large amount  of saliva.  Having an upset or bloated stomach.  Belching.  Chest pain.  Shortness of breath or wheezing.  Ongoing (chronic) cough or a night-time cough.  Wearing away of tooth enamel.  Weight loss. Different conditions can cause chest pain. Make sure to see your health care provider if you experience chest pain. DIAGNOSIS Your health care provider will take a medical history and perform a physical exam. To determine if you have mild or severe GERD, your health care provider may also monitor how you respond to treatment. You may also have other tests, including:  An endoscopy toexamine your stomach and esophagus with a small camera.  A test thatmeasures the acidity  level in your esophagus.  A test thatmeasures how much pressure is on your esophagus.  A barium swallow or modified barium swallow to show the shape, size, and functioning of your esophagus. TREATMENT The goal of treatment is to help relieve your symptoms and to prevent complications. Treatment for this condition may vary depending on how severe your symptoms are. Your health care provider may recommend:  Changes to your diet.  Medicine.  Surgery. HOME CARE INSTRUCTIONS Diet  Follow a diet as recommended by your health care provider. This may involve avoiding foods and drinks such as:  Coffee and tea (with or without caffeine).  Drinks that containalcohol.  Energy drinks and sports drinks.  Carbonated drinks or sodas.  Chocolate and cocoa.  Peppermint and mint flavorings.  Garlic and onions.  Horseradish.  Spicy and acidic foods, including peppers, chili powder, curry powder, vinegar, hot sauces, and barbecue sauce.  Citrus fruit juices and citrus fruits, such as oranges, lemons, and limes.  Tomato-based foods, such as red sauce, chili, salsa, and pizza with red sauce.  Fried and fatty foods, such as donuts, french fries, potato chips, and high-fat dressings.  High-fat meats, such as hot  dogs and fatty cuts of red and white meats, such as rib eye steak, sausage, ham, and bacon.  High-fat dairy items, such as whole milk, butter, and cream cheese.  Eat small, frequent meals instead of large meals.  Avoid drinking large amounts of liquid with your meals.  Avoid eating meals during the 2-3 hours before bedtime.  Avoid lying down right after you eat.  Do not exercise right after you eat. General Instructions  Pay attention to any changes in your symptoms.  Take over-the-counter and prescription medicines only as told by your health care provider. Do not take aspirin, ibuprofen, or other NSAIDs unless your health care provider told you to do so.  Do not use any tobacco products, including cigarettes, chewing tobacco, and e-cigarettes. If you need help quitting, ask your health care provider.  Wear loose-fitting clothing. Do not wear anything tight around your waist that causes pressure on your abdomen.  Raise (elevate) the head of your bed 6 inches (15cm).  Try to reduce your stress, such as with yoga or meditation. If you need help reducing stress, ask your health care provider.  If you are overweight, reduce your weight to an amount that is healthy for you. Ask your health care provider for guidance about a safe weight loss goal.  Keep all follow-up visits as told by your health care provider. This is important. SEEK MEDICAL CARE IF:  You have new symptoms.  You have unexplained weight loss.  You have difficulty swallowing, or it hurts to swallow.  You have wheezing or a persistent cough.  Your symptoms do not improve with treatment.  You have a hoarse voice. SEEK IMMEDIATE MEDICAL CARE IF:  You have pain in your arms, neck, jaw, teeth, or back.  You feel sweaty, dizzy, or light-headed.  You have chest pain or shortness of breath.  You vomit and your vomit looks like blood or coffee grounds.  You faint.  Your stool is bloody or black.  You  cannot swallow, drink, or eat.   This information is not intended to replace advice given to you by your health care provider. Make sure you discuss any questions you have with your health care provider.   Document Released: 11/22/2004 Document Revised: 11/03/2014 Document Reviewed: 06/09/2014 Elsevier Interactive Patient Education [employer]

## 2015-04-25 NOTE — Anesthesia Postprocedure Evaluation (Signed)
Anesthesia Post Note  Patient: Kristine Vaughn  Procedure(s) Performed: Procedure(s) (LRB): COLONOSCOPY WITH PROPOFOL (N/A) ESOPHAGOGASTRODUODENOSCOPY (EGD) WITH PROPOFOL (N/A) BIOPSY ESOPHAGEAL DILATION  Patient location during evaluation: PACU Anesthesia Type: MAC Level of consciousness: awake and alert and oriented Pain management: pain level controlled Vital Signs Assessment: post-procedure vital signs reviewed and stable Respiratory status: spontaneous breathing and patient connected to face mask oxygen Cardiovascular status: stable Postop Assessment: no signs of nausea or vomiting Anesthetic complications: no    Last Vitals:  Filed Vitals:   04/25/15 0945 04/25/15 0950  BP: 92/58 93/57  Pulse: 80 80  Temp:    Resp: 18 18    Last Pain: There were no vitals filed for this visit.               ADAMS, AMY A

## 2015-04-25 NOTE — Interval H&P Note (Signed)
History and Physical Interval Note:  04/25/2015 9:32 AM  Kristine Vaughn  has presented today for surgery, with the diagnosis of GERD, dypsphagia, hematochezia, constipation  The various methods of treatment have been discussed with the patient and family. After consideration of risks, benefits and other options for treatment, the patient has consented to  Procedure(s) with comments: COLONOSCOPY WITH PROPOFOL (N/A) - 0915 - moved to 10:00 - office to notify ESOPHAGOGASTRODUODENOSCOPY (EGD) WITH PROPOFOL (N/A) as a surgical intervention .  The patient's history has been reviewed, patient examined, no change in status, stable for surgery.  I have reviewed the patient's chart and labs.  Questions were answered to the patient's satisfaction.     Kristine Vaughn  No change. EGD colonoscopy today per plan. The risks, benefits, limitations, imponderables and alternatives regarding both EGD and colonoscopy have been reviewed with the patient. Questions have been answered. All parties agreeable.

## 2015-04-25 NOTE — Anesthesia Preprocedure Evaluation (Addendum)
Anesthesia Evaluation  Patient identified by MRN, date of birth, ID band Patient awake    Reviewed: Allergy & Precautions, NPO status , Patient's Chart, lab work & pertinent test results  History of Anesthesia Complications Negative for: history of anesthetic complications  Airway Mallampati: I  TM Distance: >3 FB Neck ROM: Full    Dental  (+) Dental Advisory Given, Edentulous Upper, Edentulous Lower   Pulmonary Current Smoker,    Pulmonary exam normal        Cardiovascular negative cardio ROS Normal cardiovascular exam     Neuro/Psych PSYCHIATRIC DISORDERS Bipolar Disorder negative neurological ROS     GI/Hepatic Neg liver ROS, GERD  ,  Endo/Other  negative endocrine ROS  Renal/GU negative Renal ROS  negative genitourinary   Musculoskeletal negative musculoskeletal ROS (+)   Abdominal   Peds negative pediatric ROS (+)  Hematology negative hematology ROS (+)   Anesthesia Other Findings   Reproductive/Obstetrics negative OB ROS                            Anesthesia Physical Anesthesia Plan  ASA: III  Anesthesia Plan: MAC   Post-op Pain Management:    Induction: Intravenous  Airway Management Planned: Simple Face Mask  Additional Equipment:   Intra-op Plan:   Post-operative Plan:   Informed Consent: I have reviewed the patients History and Physical, chart, labs and discussed the procedure including the risks, benefits and alternatives for the proposed anesthesia with the patient or authorized representative who has indicated his/her understanding and acceptance.   Dental advisory given  Plan Discussed with: CRNA, Anesthesiologist and Surgeon  Anesthesia Plan Comments:         Anesthesia Quick Evaluation

## 2015-04-25 NOTE — H&P (View-Only) (Signed)
Primary Care Physician:  Ardyth Man, MD Primary Gastroenterologist:  Dr. Jena Gauss  Chief Complaint  Patient presents with  . Rectal Bleeding  . Dysphagia    HPI:   35 year old female referred by PCP for GERD and consideration of endoscopy. PCP notes reviewed. Last saw PCP on 02/22/2015 for "feels like something is going to burst, hiccups, really bad pressure." Question need for possible endoscopy. Appears to have been started on Dexilant 60 mg a day as she was taking omeprazole previously.  Today she states she had never had GERD before 3-4 months ago, begun abruptly. Was initially started on omeprazole which helped some but she began having worsening symptoms and was switched to Dexilant which did not work any better. Carafate tabs have helped some. Hasn't had recurrence since starting Carafate twice a day. Is having dysphagia symptoms daily, has to drink lots of fluids to get food to pass. Has some generalized abdominal pain described as dull, intermittent, and is 5/10 when it happens. No improvement with bowel movement. Also with increased nausea and vomiting, last episode of emesis about 1 month ago. Has hematochezia about once a week in the stool which has been occurring "for years." Was previously seen by GI with EGD and TCS at [facility] about 2009. Told she has IBS. Has diarrhea all the time. Admits black tarry stools with about 2 out of every 5 bowel movements. Has a bowel movement every 3-4 days, loose stools, occasional straining. Also admits chest pain and dyspnea which she hasn't told her PCP about. Has been going on since ER 1 month ago for chest pain and dx of GERD/esophagitis, given GI cocktail and PCP follow-up. Denies dizziness, lightheadedness, syncope, near syncope. Denies any other upper or lower GI symptoms.   Past Medical History  Diagnosis Date  . GERD (gastroesophageal reflux disease)   . PTSD (post-traumatic stress disorder)   . IBS (irritable bowel syndrome)   .  Migraines   . Bipolar disorder (HCC)   . Chronic back pain     Past Surgical History  Procedure Laterality Date  . None to date  03/28/15  . Myringotomy with tube placement    . Cervical cone biopsy      Current Outpatient Prescriptions  Medication Sig Dispense Refill  . buPROPion (WELLBUTRIN SR) 150 MG 12 hr tablet Take by mouth daily. Take 3 tabs every morning.    . cetirizine (ZYRTEC) 10 MG tablet Take 20 mg by mouth daily.    . clonazePAM (KLONOPIN) 1 MG tablet Take 3 mg by mouth daily.    . fluticasone (FLONASE) 50 MCG/ACT nasal spray Place 1 spray into both nostrils daily.    Marland Kitchen loperamide (IMODIUM) 2 MG capsule Take 2 mg by mouth as needed for diarrhea or loose stools.    Marland Kitchen omeprazole (PRILOSEC) 20 MG capsule Take 20 mg by mouth daily.    . pramipexole (MIRAPEX) 0.5 MG tablet Take 0.5 mg by mouth at bedtime.    . promethazine (PHENERGAN) 25 MG tablet Take 25 mg by mouth 3 (three) times daily as needed for nausea or vomiting.    . propranolol (INDERAL) 60 MG tablet Take 60 mg by mouth 2 (two) times daily.    . risperiDONE (RISPERDAL) 3 MG tablet Take 6 mg by mouth at bedtime.    . rizatriptan (MAXALT) 10 MG tablet Take 10 mg by mouth as needed for migraine. May repeat in 2 hours if needed    . traZODone (DESYREL) 100 MG tablet  Take 100-200 mg by mouth at bedtime.     . trihexyphenidyl (ARTANE) 2 MG tablet Take 2 mg by mouth at bedtime.    Marland Kitchen amitriptyline (ELAVIL) 10 MG tablet Take 10 mg by mouth at bedtime.    Marland Kitchen HYDROcodone-acetaminophen (NORCO/VICODIN) 5-325 MG tablet Take 1 tablet by mouth daily as needed for moderate pain.   0  . levonorgestrel (MIRENA) 20 MCG/24HR IUD 1 each by Intrauterine route once.    . polyethylene glycol-electrolytes (NULYTELY/GOLYTELY) 420 g solution Take 4,000 mLs by mouth once. 4000 mL 0  . sucralfate (CARAFATE) 1 g tablet Take 1 g by mouth daily.  0   No current facility-administered medications for this visit.    Allergies as of 03/28/2015 -  Review Complete 03/28/2015  Allergen Reaction Noted  . Levsin [hyoscyamine] Other (See Comments) 03/28/2015    Family History  Problem Relation Age of Onset  . Colon cancer Neg Hx     Social History   Social History  . Marital Status: Legally Separated    Spouse Name: N/A  . Number of Children: N/A  . Years of Education: N/A   Occupational History  . Not on file.   Social History Main Topics  . Smoking status: Current Every Day Smoker -- 0.50 packs/day for 17 years    Types: Cigarettes  . Smokeless tobacco: Never Used  . Alcohol Use: No  . Drug Use: Yes    Special: Marijuana     Comment: last used feb 1.  . Sexual Activity: Yes    Birth Control/ Protection: IUD   Other Topics Concern  . Not on file   Social History Narrative    Review of Systems: General: Negative for anorexia, weight loss, fever, chills, fatigue, weakness. Eyes: Negative for vision changes.  ENT: Negative for hoarseness. CV: Negative for chest pain, angina, palpitations, peripheral edema.  Respiratory: Negative for dyspnea at rest, cough, sputum, wheezing.  GI: See history of present illness. Derm: Negative for rash or itching.  Endo: Negative for unusual weight change.  Heme: Negative for bruising or bleeding. Allergy: Negative for rash or hives.    Physical Exam: BP 135/90 mmHg  Pulse 93  Temp(Src) 97.1 F (36.2 C) (Oral)  Ht  (1.676 m)  Wt 162 lb 9.6 oz (73.755 kg)  BMI 26.26 kg/m2  LMP  (LMP Unknown) General:   Alert and oriented. Pleasant and cooperative. Well-nourished and well-developed.  Head:  Normocephalic and atraumatic. Eyes:  Without icterus, sclera clear and conjunctiva pink.  Ears:  Normal auditory acuity. Cardiovascular:  S1, S2 present without murmurs appreciated. Extremities without clubbing or edema. Respiratory:  Clear to auscultation bilaterally. No wheezes, rales, or rhonchi. No distress.  Gastrointestinal:  +BS, soft, and non-distended. No HSM noted. No  guarding or rebound. No masses appreciated. Minimal generalized abdominal TTP. Rectal:  Deferred  Musculoskalatal:  Symmetrical without gross deformities. Normal posture. Skin:  Intact without significant lesions or rashes. Neurologic:  Alert and oriented x4;  grossly normal neurologically. Psych:  Alert and cooperative. Normal mood and affect. Heme/Lymph/Immune: No excessive bruising noted.    04/22/2015 3:55 PM

## 2015-04-27 ENCOUNTER — Encounter (HOSPITAL_COMMUNITY): Payer: Self-pay | Admitting: Internal Medicine

## 2015-04-28 ENCOUNTER — Encounter: Payer: Self-pay | Admitting: Internal Medicine

## 2015-05-04 ENCOUNTER — Other Ambulatory Visit: Payer: Self-pay

## 2015-05-05 ENCOUNTER — Telehealth: Payer: Self-pay

## 2015-05-05 NOTE — Telephone Encounter (Signed)
Pt called to see if we could please send in her refill for her. Please advise

## 2015-05-06 MED ORDER — DEXLANSOPRAZOLE 60 MG PO CPDR: 60.0000 mg | DELAYED_RELEASE_CAPSULE | Freq: Every day | ORAL | Status: DC

## 2015-05-09 NOTE — Telephone Encounter (Signed)
RX done 3/10

## 2015-05-13 ENCOUNTER — Telehealth: Payer: Self-pay

## 2015-05-13 MED ORDER — PANTOPRAZOLE SODIUM 40 MG PO TBEC: 40.0000 mg | DELAYED_RELEASE_TABLET | Freq: Every day | ORAL | Status: DC

## 2015-05-13 NOTE — Telephone Encounter (Signed)
Pt is aware.  

## 2015-05-13 NOTE — Telephone Encounter (Signed)
Received fax from the pharmacy, pts insurance wont pay for dexilant until she has tried and failed 4 generic ppi's: omeprazole, lansoprazole, pantoprazole, and rabeprazole. Pt has only tried and failed omeprazole. She is willing to try the others. She is aware that she will need to try them for at least 30 days and will call me if its not working so we can send in another one for her to try.   [facility].

## 2015-05-13 NOTE — Telephone Encounter (Signed)
Will trial each for 30 days, starting with pantoprazole. Notify patient that new Rx sent to her pharmacy for 30 days and 0 refills. Please call in 30 days with progress report.

## 2015-05-24 ENCOUNTER — Telehealth: Payer: Self-pay | Admitting: Nurse Practitioner

## 2015-05-24 ENCOUNTER — Ambulatory Visit: Payer: Medicaid - Out of State | Admitting: Nurse Practitioner

## 2015-05-24 ENCOUNTER — Encounter: Payer: Self-pay | Admitting: Nurse Practitioner

## 2015-05-24 NOTE — Telephone Encounter (Signed)
Noted  

## 2015-05-24 NOTE — Telephone Encounter (Signed)
PATIENT WAS A NO SHOW AND LETTER SENT  °

## 2015-06-23 ENCOUNTER — Telehealth: Payer: Self-pay

## 2015-06-23 ENCOUNTER — Ambulatory Visit (INDEPENDENT_AMBULATORY_CARE_PROVIDER_SITE_OTHER): Payer: Medicaid - Out of State | Admitting: Gastroenterology

## 2015-06-23 ENCOUNTER — Encounter: Payer: Self-pay | Admitting: Gastroenterology

## 2015-06-23 VITALS — BP 126/90 | HR 110 | Temp 97.5°F | Ht 66.0 in | Wt 161.0 lb

## 2015-06-23 DIAGNOSIS — K2100 Gastro-esophageal reflux disease with esophagitis, without bleeding: Secondary | ICD-10-CM

## 2015-06-23 DIAGNOSIS — K920 Hematemesis: Secondary | ICD-10-CM | POA: Insufficient documentation

## 2015-06-23 DIAGNOSIS — R1030 Lower abdominal pain, unspecified: Secondary | ICD-10-CM | POA: Diagnosis not present

## 2015-06-23 DIAGNOSIS — R110 Nausea: Secondary | ICD-10-CM

## 2015-06-23 DIAGNOSIS — K921 Melena: Secondary | ICD-10-CM | POA: Diagnosis not present

## 2015-06-23 DIAGNOSIS — R197 Diarrhea, unspecified: Secondary | ICD-10-CM | POA: Insufficient documentation

## 2015-06-23 LAB — CBC WITH DIFFERENTIAL/PLATELET
BASOS PCT: 1 %
Basophils Absolute: 80 cells/uL (ref 0–200)
Eosinophils Absolute: 160 cells/uL (ref 15–500)
Eosinophils Relative: 2 %
HEMATOCRIT: 37.1 % (ref 35.0–45.0)
HEMOGLOBIN: 12.2 g/dL (ref 11.7–15.5)
LYMPHS ABS: 2560 {cells}/uL (ref 850–3900)
Lymphocytes Relative: 32 %
MCH: 28 pg (ref 27.0–33.0)
MCHC: 32.9 g/dL (ref 32.0–36.0)
MCV: 85.3 fL (ref 80.0–100.0)
MONO ABS: 400 {cells}/uL (ref 200–950)
MPV: 10.9 fL (ref 7.5–12.5)
Monocytes Relative: 5 %
Neutro Abs: 4800 cells/uL (ref 1500–7800)
Neutrophils Relative %: 60 %
Platelets: 298 10*3/uL (ref 140–400)
RBC: 4.35 MIL/uL (ref 3.80–5.10)
RDW: 14.1 % (ref 11.0–15.0)
WBC: 8 10*3/uL (ref 3.8–10.8)

## 2015-06-23 LAB — COMPREHENSIVE METABOLIC PANEL
ALBUMIN: 4.1 g/dL (ref 3.6–5.1)
ALK PHOS: 104 U/L (ref 33–115)
ALT: 8 U/L (ref 6–29)
AST: 14 U/L (ref 10–30)
BILIRUBIN TOTAL: 0.3 mg/dL (ref 0.2–1.2)
BUN: 6 mg/dL — ABNORMAL LOW (ref 7–25)
CALCIUM: 9.3 mg/dL (ref 8.6–10.2)
CO2: 21 mmol/L (ref 20–31)
Chloride: 109 mmol/L (ref 98–110)
Creat: 1.13 mg/dL — ABNORMAL HIGH (ref 0.50–1.10)
GLUCOSE: 89 mg/dL (ref 65–99)
Potassium: 4.3 mmol/L (ref 3.5–5.3)
Sodium: 141 mmol/L (ref 135–146)
Total Protein: 6.8 g/dL (ref 6.1–8.1)

## 2015-06-23 LAB — HCG, SERUM, QUALITATIVE: Preg, Serum: NEGATIVE

## 2015-06-23 LAB — LIPASE: Lipase: 7 U/L (ref 7–60)

## 2015-06-23 MED ORDER — SUCRALFATE 1 G PO TABS: 1.0000 g | ORAL_TABLET | Freq: Three times a day (TID) | ORAL | Status: DC

## 2015-06-23 MED ORDER — PANTOPRAZOLE SODIUM 40 MG PO TBEC: 40.0000 mg | DELAYED_RELEASE_TABLET | Freq: Two times a day (BID) | ORAL | Status: AC

## 2015-06-23 NOTE — Telephone Encounter (Signed)
I called the PCP office and had then fax over the office note from today. They did not do any blood work in the office. LSL has the office note in hand

## 2015-06-23 NOTE — Telephone Encounter (Signed)
Did PCP get a Hgb on her? Make sure we have records from today's visit.

## 2015-06-23 NOTE — Progress Notes (Signed)
Primary Care Physician: Ardyth ManPARK,CHAN M, MD  Primary Gastroenterologist:  Roetta SessionsMichael Rourk, MD   Chief Complaint  Patient presents with  . vomiting blood    HPI: Kristine Vaughn is a 35 y.o. female here for urgent office visit for hematemesis. Patient presented to PCP this morning and we were called to see if we can see her today. She was seen back in January for initial new patient consultation for new onset GERD. Also complained of intermittent hematochezia. Has a history of IBS.  Patient had EGD and ileocolonoscopy in February but Dr. Jena Gaussourk. She had erosive reflux esophagitis and reactive gastritis/chemical gastritis. Colonoscopy with external hemorrhoids but normal colon and terminal ileum.  Patient woke up this morning and had several episodes of vomiting. With the first episode she noted bright red blood. Happened a couple of times, about 3 mouth full's of blood. Last episode around 9 AM. Denies current melena. States her stool was black 1 week ago. Has occasional bright red blood per rectum. Continues to have daily nausea, diarrhea. At least one to 2 loose stools daily. Complains of mid to lower abdominal pain daily. No modifying or alleviating factors. States that she lives off [employer] [employer]. Does eat supper. No weight loss. She states she has gained 30 pounds in 6 months. She doesn't understand why. Vomits several times per week. No appetite.  Patient states she ran out of her pantoprazole about 1 month ago. She is having trouble swallowing Carafate even if she cuts in half. Advised her to make a slurry with it. Zofran works better than Phenergan. Complains of chronic back pain. Gets 20 Vicodin per month. States she takes about 1/2-2 daily and then has to take Goody's powders 3 times daily for pain management. She has been taking Goody's for 10 years. No alcohol use.  Weight has been stable the last 3 months at 160 pounds per Epic.  Current Outpatient Prescriptions  Medication Sig  Dispense Refill  . amitriptyline (ELAVIL) 10 MG tablet Take 10 mg by mouth at bedtime.    Marland Kitchen. buPROPion (WELLBUTRIN SR) 150 MG 12 hr tablet Take by mouth daily. Take 3 tabs every morning.    . cetirizine (ZYRTEC) 10 MG tablet Take 20 mg by mouth daily.    . clonazePAM (KLONOPIN) 1 MG tablet Take 3 mg by mouth daily.    . fluticasone (FLONASE) 50 MCG/ACT nasal spray Place 1 spray into both nostrils daily.    Marland Kitchen. HYDROcodone-acetaminophen (NORCO/VICODIN) 5-325 MG tablet Take 1 tablet by mouth daily as needed for moderate pain.   0  . levonorgestrel (MIRENA) 20 MCG/24HR IUD 1 each by Intrauterine route once.    . loperamide (IMODIUM) 2 MG capsule Take 2 mg by mouth as needed for diarrhea or loose stools.    . pantoprazole (PROTONIX) 40 MG tablet Take 1 tablet (40 mg total) by mouth daily. 30 tablet 0  . pramipexole (MIRAPEX) 0.5 MG tablet Take 0.5 mg by mouth at bedtime.    . promethazine (PHENERGAN) 25 MG tablet Take 25 mg by mouth 3 (three) times daily as needed for nausea or vomiting.    . propranolol (INDERAL) 60 MG tablet Take 60 mg by mouth 2 (two) times daily.    . risperiDONE (RISPERDAL) 3 MG tablet Take 6 mg by mouth at bedtime.    . rizatriptan (MAXALT) 10 MG tablet Take 10 mg by mouth as needed for migraine. May repeat in 2 hours if needed    . sucralfate (  CARAFATE) 1 g tablet Take 1 g by mouth daily.  0  . traZODone (DESYREL) 100 MG tablet Take 100-200 mg by mouth at bedtime.     . trihexyphenidyl (ARTANE) 2 MG tablet Take 2 mg by mouth at bedtime.     No current facility-administered medications for this visit.    Allergies as of 06/23/2015 - Review Complete 06/23/2015  Allergen Reaction Noted  . Levsin [hyoscyamine] Other (See Comments) 03/28/2015   Past Medical History  Diagnosis Date  . GERD (gastroesophageal reflux disease)   . PTSD (post-traumatic stress disorder)   . IBS (irritable bowel syndrome)   . Migraines   . Bipolar disorder (HCC)   . Chronic back pain    Past  Surgical History  Procedure Laterality Date  . None to date  03/28/15  . Myringotomy with tube placement    . Cervical cone biopsy    . Colonoscopy with propofol N/A 04/25/2015    RUE:[id number] hemorrhoids otherwise norma; including normal distal TI  . Esophagogastroduodenoscopy (egd) with propofol N/A 04/25/2015    BJY:NWGNFAO reflux abnormal gastric mucosa s/p bx (reactive gastritis/chemical gastritis)  . Biopsy  04/25/2015    Procedure: BIOPSY;  Surgeon: Corbin Ade, MD;  Location: [facility];  Service: Endoscopy;;  . Esophageal dilation  04/25/2015    Procedure: ESOPHAGEAL DILATION;  Surgeon: Corbin Ade, MD;  Location: [facility];  Service: Endoscopy;;   Family History  Problem Relation Age of Onset  . Colon cancer Neg Hx   . Celiac disease Neg Hx   . Inflammatory bowel disease Neg Hx    Social History   Social History  . Marital Status: Legally Separated    Spouse Name: N/A  . Number of Children: N/A  . Years of Education: N/A   Social History Main Topics  . Smoking status: Current Every Day Smoker -- 0.50 packs/day for 17 years    Types: Cigarettes  . Smokeless tobacco: Never Used  . Alcohol Use: No  . Drug Use: Yes    Special: Marijuana     Comment: last used feb 1.  . Sexual Activity: Yes    Birth Control/ Protection: IUD   Other Topics Concern  . None   Social History Narrative    ROS:  General: Negative for anorexia, weight loss, fever, chills, fatigue, weakness. ENT: Negative for hoarseness, difficulty swallowing , nasal congestion. CV: Negative for chest pain, angina, palpitations, dyspnea on exertion, peripheral edema.  Respiratory: Negative for dyspnea at rest, dyspnea on exertion, cough, sputum, wheezing.  GI: See history of present illness. GU:  Negative for dysuria, hematuria, urinary incontinence, urinary frequency, nocturnal urination.  Endo: See history of present illness  Physical Examination:   BP 126/90 mmHg  Pulse 110  Temp(Src)  97.5 F (36.4 C)  Ht  (1.676 m)  Wt 161 lb (73.029 kg)  BMI 26.00 kg/m2  General: Well-nourished, well-developed in no acute distress. Accompanied by fianc, Marlyn Corporal. Patient states we can discuss results with him. Eyes: No icterus. Mouth: Oropharyngeal mucosa moist and pink , no lesions erythema or exudate. Lungs: Clear to auscultation bilaterally.  Heart: Regular rate and rhythm, no murmurs rubs or gallops.  Abdomen: Bowel sounds are normal, mid to lower tenderness with deep palpation, slight distention, no hepatosplenomegaly or masses, no abdominal bruits or hernia , no rebound or guarding.   Extremities: No lower extremity edema. No clubbing or deformities. Neuro: Alert and oriented x 4   Skin: Warm and dry,  no jaundice.   Psych: Alert and cooperative, normal mood and affect.  Labs:  Lab Results  Component Value Date   WBC 8.2 03/28/2015   HGB 11.9* 03/28/2015   HCT 34.9* 03/28/2015   MCV 86.8 03/28/2015   PLT 210 03/28/2015    Imaging Studies: No results found.

## 2015-06-23 NOTE — Patient Instructions (Signed)
1. Please stop goody powders. They can cause significant damage to your GI tract. 2. Referral to pain management. 3. Please have your labs done as usually the office. I will contact you tonight if there are any emergent issues otherwise we will call you in the morning. 4. If you have recurrent episodes of vomiting blood which are significant, gastritis emergency department. 5. Resume pantoprazole but increase to twice daily before meals. Prescription sent to pharmacy. Take our samples of Dexilant once daily starting today until you are able to obtain your prescription of pantoprazole. 6. Once your labs are back, we will provide further recommendations.

## 2015-06-23 NOTE — Assessment & Plan Note (Signed)
35 year old female with history of reflux esophagitis/gastritis on EGD in February who presents acutely for hematemesis. She complains of ongoing daily nausea with vomiting several times per week, daily diarrhea, lower abdominal pain, bloating. Workup of EGD and colonoscopy as outlined. Suspect hematemesis due to reflux esophagitis and/or gastritis in the setting of BC powder use and no longer being on PPI therapy (out of medication for one month). Suspect self-limiting hematemesis. We will check stat labs today. Warning signs/symptoms discussed for which she would need to go to the nearest emergency department.  Frequent nausea and intermittent vomiting, possibly related to reflux esophagitis/gastritis but cannot exclude biliary etiology. Patient has low abdominal pain with some lower abdominal distention, no discrete mass. Will likely require further imaging. Ongoing diarrhea with no evidence of Crohn's previously. We will check for celiac disease. Suspect IBS D. Obtain labs. We'll go from there. Samples of Dexilant provided until she can get her pantoprazole field. Urged her to discontinue BC powder use. We have arranged for pain clinic referral.

## 2015-06-23 NOTE — Telephone Encounter (Signed)
Received phone call from her PCP stating that she is in there office throwing up blood. She is coming in to the office today at 2:00.

## 2015-06-24 ENCOUNTER — Other Ambulatory Visit: Payer: Self-pay

## 2015-06-24 ENCOUNTER — Telehealth: Payer: Self-pay | Admitting: Internal Medicine

## 2015-06-24 DIAGNOSIS — R140 Abdominal distension (gaseous): Secondary | ICD-10-CM

## 2015-06-24 DIAGNOSIS — R1032 Left lower quadrant pain: Secondary | ICD-10-CM

## 2015-06-24 LAB — TISSUE TRANSGLUTAMINASE, IGA: Tissue Transglutaminase Ab, IgA: 1 U/mL (ref <4)

## 2015-06-24 NOTE — Progress Notes (Signed)
Quick Note:  Pt is aware of results. OK to schedule the CT. She is still vomiting blood at about the same amount. See separate work note where she called in today. ______

## 2015-06-24 NOTE — Telephone Encounter (Signed)
Patient aware of reasons to go to ER. Plans as outlined yesterday and CT planned.

## 2015-06-24 NOTE — Telephone Encounter (Signed)
Pt actually said she needs the note for yesterday and today. OK per Tana CoastLeslie Lewis, PA.  Note has been faxed to pt's work and pt is aware we were faxing.

## 2015-06-24 NOTE — Telephone Encounter (Signed)
Kristine Vaughn, pt is aware of her results and ok to schedule the CT. I have [facility] to [facility] to schedule. She said she is still vomiting up about the same amount of blood. She needs work note faxed to [phone] for her to be out today and tomorrow.  She needs it faxed by 11:00 Am if possible.

## 2015-06-24 NOTE — Telephone Encounter (Signed)
Pt called this morning. She was seen yesterday and wanted to see if LSL would write her a work note for today and tomorrow because she is still throwing up blood. She needs it faxed to her employer by 11am. I told her that LSL was with patients and I would send a phone note and also transfer her to nurse's VM.

## 2015-06-24 NOTE — Progress Notes (Signed)
Quick Note:  Please let patient know her CBC, LFTs were all good. Celiac screen pending. Has she had any further episodes of vomiting blood? Recommend CT A/P with contrast. Dx: vomiting, low abdominal pain, abdominal distention. ______

## 2015-06-25 LAB — IGA: IgA: 202 mg/dL (ref 69–380)

## 2015-06-27 ENCOUNTER — Emergency Department (HOSPITAL_COMMUNITY)
Admission: EM | Admit: 2015-06-27 | Discharge: 2015-06-27 | Disposition: A | Discharge status: Home / Self Care | Payer: Medicaid - Out of State | Attending: Emergency Medicine | Admitting: Emergency Medicine

## 2015-06-27 ENCOUNTER — Emergency Department (HOSPITAL_COMMUNITY): Payer: Medicaid - Out of State

## 2015-06-27 ENCOUNTER — Telehealth: Payer: Self-pay | Admitting: Internal Medicine

## 2015-06-27 ENCOUNTER — Telehealth: Payer: Self-pay

## 2015-06-27 ENCOUNTER — Encounter (HOSPITAL_COMMUNITY): Payer: Self-pay | Admitting: Emergency Medicine

## 2015-06-27 DIAGNOSIS — F319 Bipolar disorder, unspecified: Secondary | ICD-10-CM | POA: Diagnosis not present

## 2015-06-27 DIAGNOSIS — F17210 Nicotine dependence, cigarettes, uncomplicated: Secondary | ICD-10-CM | POA: Insufficient documentation

## 2015-06-27 DIAGNOSIS — R1031 Right lower quadrant pain: Secondary | ICD-10-CM | POA: Diagnosis not present

## 2015-06-27 DIAGNOSIS — K920 Hematemesis: Secondary | ICD-10-CM | POA: Insufficient documentation

## 2015-06-27 DIAGNOSIS — K2970 Gastritis, unspecified, without bleeding: Secondary | ICD-10-CM

## 2015-06-27 LAB — CBC
HCT: 38.1 % (ref 36.0–46.0)
Hemoglobin: 12.3 g/dL (ref 12.0–15.0)
MCH: 28.7 pg (ref 26.0–34.0)
MCHC: 32.3 g/dL (ref 30.0–36.0)
MCV: 88.8 fL (ref 78.0–100.0)
PLATELETS: 251 10*3/uL (ref 150–400)
RBC: 4.29 MIL/uL (ref 3.87–5.11)
RDW: 13.7 % (ref 11.5–15.5)
WBC: 10.1 10*3/uL (ref 4.0–10.5)

## 2015-06-27 LAB — COMPREHENSIVE METABOLIC PANEL
ALK PHOS: 101 U/L (ref 38–126)
ALT: 10 U/L — AB (ref 14–54)
AST: 17 U/L (ref 15–41)
Albumin: 4.1 g/dL (ref 3.5–5.0)
Anion gap: 7 (ref 5–15)
BILIRUBIN TOTAL: 0.4 mg/dL (ref 0.3–1.2)
BUN: 5 mg/dL — AB (ref 6–20)
CALCIUM: 9.3 mg/dL (ref 8.9–10.3)
CHLORIDE: 109 mmol/L (ref 101–111)
CO2: 23 mmol/L (ref 22–32)
CREATININE: 1.14 mg/dL — AB (ref 0.44–1.00)
GFR calc Af Amer: >60 mL/min (ref >60)
Glucose, Bld: 91 mg/dL (ref 65–99)
Potassium: 3.9 mmol/L (ref 3.5–5.1)
Sodium: 139 mmol/L (ref 135–145)
Total Protein: 7.6 g/dL (ref 6.5–8.1)

## 2015-06-27 LAB — PREGNANCY, URINE: Preg Test, Ur: NEGATIVE

## 2015-06-27 LAB — URINE MICROSCOPIC-ADD ON

## 2015-06-27 LAB — URINALYSIS, ROUTINE W REFLEX MICROSCOPIC
Bilirubin Urine: NEGATIVE
GLUCOSE, UA: NEGATIVE mg/dL
HGB URINE DIPSTICK: NEGATIVE
KETONES UR: NEGATIVE mg/dL
Nitrite: NEGATIVE
PROTEIN: NEGATIVE mg/dL
Specific Gravity, Urine: <1.005 — ABNORMAL LOW (ref 1.005–1.030)
pH: 5.5 (ref 5.0–8.0)

## 2015-06-27 LAB — LIPASE, BLOOD: LIPASE: 20 U/L (ref 11–51)

## 2015-06-27 MED ORDER — IOPAMIDOL (ISOVUE-300) INJECTION 61%
100.0000 mL | Freq: Once | INTRAVENOUS | Status: AC | PRN
  Administered 2015-06-27: 100 mL via INTRAVENOUS

## 2015-06-27 MED ORDER — SODIUM CHLORIDE 0.9 % IV BOLUS (SEPSIS)
1000.0000 mL | Freq: Once | INTRAVENOUS | Status: AC
  Administered 2015-06-27: 1000 mL via INTRAVENOUS

## 2015-06-27 MED ORDER — ONDANSETRON HCL 4 MG/2ML IJ SOLN
4.0000 mg | Freq: Once | INTRAMUSCULAR | Status: AC
  Administered 2015-06-27: 4 mg via INTRAVENOUS
  Filled 2015-06-27: qty 2

## 2015-06-27 MED ORDER — HYDROMORPHONE HCL 1 MG/ML IJ SOLN
0.5000 mg | Freq: Once | INTRAMUSCULAR | Status: AC
  Administered 2015-06-27: 0.5 mg via INTRAVENOUS
  Filled 2015-06-27: qty 1

## 2015-06-27 MED ORDER — PANTOPRAZOLE SODIUM 40 MG IV SOLR
40.0000 mg | Freq: Once | INTRAVENOUS | Status: AC
  Administered 2015-06-27: 40 mg via INTRAVENOUS
  Filled 2015-06-27: qty 40

## 2015-06-27 NOTE — ED Notes (Signed)
Patient walked to the bathroom with minimal assistance.  

## 2015-06-27 NOTE — Progress Notes (Signed)
cc'ed to pcp °

## 2015-06-27 NOTE — Telephone Encounter (Signed)
I spoke with the patient and made her aware per Verlon AuLeslie she needed to proceed to the ER if she continued to vomit blood and experience pain.  [facility]

## 2015-06-27 NOTE — ED Notes (Addendum)
Pt reports RLQ abdominal pain, hematemesis, and diarrhea x 1 week. Pt sees Dr. Jena Gaussourk for same complaint. States she was diagnosed with acid burns in her esophagus and hemorrhoids.

## 2015-06-27 NOTE — Telephone Encounter (Signed)
VOMITING BLOOD AND IN A LOT OF PAIN, [phone], PLEASE ADVISE

## 2015-06-27 NOTE — Telephone Encounter (Signed)
Can not find a pain clinic in  that will take her insurance. Please advise

## 2015-06-27 NOTE — ED Provider Notes (Signed)
CSN: [id number]     Arrival date & time 06/27/15  1711 History  By signing my name below, I, [name], attest that this documentation has been prepared under the direction and in the presence of Bethann BerkshireJoseph Ciin Brazzel, MD. Electronically Signed: [name], ED Scribe. 06/27/2015. 10:07 PM.   Chief Complaint  Patient presents with  . Abdominal Pain  . Hematemesis   Patient is a 35 y.o. female presenting with vomiting. The history is provided by the patient. No language interpreter was used.  Emesis Severity:  Mild Duration:  1 week Timing:  Intermittent Quality:  Bright red blood Progression:  Unchanged Chronicity:  New Recent urination:  Normal Relieved by:  Nothing Worsened by:  Nothing tried Ineffective treatments:  None tried Associated symptoms: abdominal pain   Associated symptoms: no diarrhea    HPI Comments: Kristine Vaughn is a 35 y.o. female who presents to the Emergency Department with a PMHx of GERD and IBS complaining of hematemesis with associated RLQ abdominal pain that began 1 week ago. Pt states she has been vomiting bright red blood. Pt was seen by Dr. Jena Gaussourk for these symptoms who recommended an outpatient CT abdomen which is scheduled for tomorrow, He also referred to the pt to the ED for further evaluation. Pt denies any other complaints at this time.   Past Medical History  Diagnosis Date  . GERD (gastroesophageal reflux disease)   . PTSD (post-traumatic stress disorder)   . IBS (irritable bowel syndrome)   . Migraines   . Bipolar disorder (HCC)   . Chronic back pain    Past Surgical History  Procedure Laterality Date  . None to date  03/28/15  . Myringotomy with tube placement    . Cervical cone biopsy    . Colonoscopy with propofol N/A 04/25/2015    WUJ:[id number]:external hemorrhoids otherwise norma; including normal distal TI  . Esophagogastroduodenoscopy (egd) with propofol N/A 04/25/2015    GNF:AOZHYQMRMR:erosive reflux esophagitis and  abnormal gastric mucosa s/p bx  (reactive gastritis/chemical gastritis)  . Biopsy  04/25/2015    Procedure: BIOPSY;  Surgeon: Corbin Adeobert M Rourk, MD;  [facility];  Service: Endoscopy;;  . Esophageal dilation  04/25/2015    Procedure: ESOPHAGEAL DILATION;  Surgeon: Corbin Adeobert M Rourk, MD;  [facility];  Service: Endoscopy;;   Family History  Problem Relation Age of Onset  . Colon cancer Neg Hx   . Celiac disease Neg Hx   . Inflammatory bowel disease Neg Hx    Social History  Substance Use Topics  . Smoking status: Current Every Day Smoker -- 0.50 packs/day for 17 years    Types: Cigarettes  . Smokeless tobacco: Never Used  . Alcohol Use: No   OB History    No data available     Review of Systems  Constitutional: Negative for appetite change and fatigue.  HENT: Negative for congestion, ear discharge and sinus pressure.   Eyes: Negative for discharge.  Respiratory: Negative for cough.   Gastrointestinal: Positive for vomiting and abdominal pain. Negative for diarrhea.  Genitourinary: Negative for frequency.  Musculoskeletal: Negative for back pain.  Skin: Negative for rash.  Neurological: Negative for seizures.  Psychiatric/Behavioral: Negative for hallucinations.    Allergies  Levsin  Home Medications   Prior to Admission medications   Medication Sig Start Date End Date Taking? Authorizing Provider  amitriptyline (ELAVIL) 10 MG tablet Take 10 mg by mouth at bedtime.    Historical Provider, MD  buPROPion [facility](WELLBUTRIN SR) 150 MG  12 hr tablet Take by mouth daily. Take 3 tabs every morning.    Historical Provider, MD  cetirizine (ZYRTEC) 10 MG tablet Take 20 mg by mouth daily.    Historical Provider, MD  clonazePAM (KLONOPIN) 1 MG tablet Take 3 mg by mouth daily.    Historical Provider, MD  fluticasone (FLONASE) 50 MCG/ACT nasal spray Place 1 spray into both nostrils daily.    Historical Provider, MD  HYDROcodone-acetaminophen (NORCO/VICODIN) 5-325 MG tablet Take 1 tablet by mouth daily as  needed for moderate pain.  03/14/15   Historical Provider, MD  levonorgestrel (MIRENA) 20 MCG/24HR IUD 1 each by Intrauterine route once.    Historical Provider, MD  loperamide (IMODIUM) 2 MG capsule Take 2 mg by mouth as needed for diarrhea or loose stools.    Historical Provider, MD  pantoprazole (PROTONIX) 40 MG tablet Take 1 tablet (40 mg total) by mouth 2 (two) times daily before a meal. 06/23/15   Aviella Kocher, PA-C  pramipexole (MIRAPEX) 0.5 MG tablet Take 0.5 mg by mouth at bedtime.    Historical Provider, MD  promethazine (PHENERGAN) 25 MG tablet Take 25 mg by mouth 3 (three) times daily as needed for nausea or vomiting.    Historical Provider, MD  propranolol (INDERAL) 60 MG tablet Take 60 mg by mouth 2 (two) times daily.    Historical Provider, MD  risperiDONE (RISPERDAL) 3 MG tablet Take 6 mg by mouth at bedtime.    Historical Provider, MD  rizatriptan (MAXALT) 10 MG tablet Take 10 mg by mouth as needed for migraine. May repeat in 2 hours if needed    Historical Provider, MD  sucralfate (CARAFATE) 1 g tablet Take 1 tablet (1 g total) by mouth 4 (four) times daily -  with meals and at bedtime. Crush in applesauce 06/23/15   Kemberly Kocher, PA-C  traZODone (DESYREL) 100 MG tablet Take 100-200 mg by mouth at bedtime.     Historical Provider, MD  trihexyphenidyl (ARTANE) 2 MG tablet Take 2 mg by mouth at bedtime.    Historical Provider, MD   BP 114/82 mmHg  Pulse 98  Temp(Src) 97.9 F (36.6 C) (Oral)  Resp 18  Ht 5\' 6"  (1.676 m)  Wt 161 lb (73.029 kg)  BMI 26.00 kg/m2  SpO2 100%   Physical Exam  Constitutional: She is oriented to person, place, and time. She appears well-developed.  HENT:  Head: Normocephalic.  Eyes: Conjunctivae and EOM are normal. No scleral icterus.  Neck: Neck supple. No thyromegaly present.  Cardiovascular: Normal rate and regular rhythm.  Exam reveals no gallop and no friction rub.   No murmur heard. Pulmonary/Chest: No stridor. She has no wheezes. She has  no rales. She exhibits no tenderness.  Abdominal: She exhibits no distension. There is no rebound.  Mild RLQ tenderness  Musculoskeletal: Normal range of motion. She exhibits no edema.  Lymphadenopathy:    She has no cervical adenopathy.  Neurological: She is oriented to person, place, and time. She exhibits normal muscle tone. Coordination normal.  Skin: No rash noted. No erythema.  Psychiatric: She has a normal mood and affect. Her behavior is normal.    ED Course  Procedures  DIAGNOSTIC STUDIES: Oxygen Saturation is 100% on room air, normal by my interpretation.    COORDINATION OF CARE: 10:04 PM Will give fluids, Zofran, Protonix, and pain medication. Will order CTA abdomen/pelvis, blood work, urinalysis, urine culture, and pregnancy screen. Discussed treatment plan with pt and father at bedside and  they agreed to plan.  Labs Review Labs Reviewed  COMPREHENSIVE METABOLIC PANEL - Abnormal; Notable for the following:    BUN 5 (*)    Creatinine, Ser 1.14 (*)    ALT 10 (*)    All other components within normal limits  URINALYSIS, ROUTINE W REFLEX MICROSCOPIC (NOT AT [facility]) - Abnormal; Notable for the following:    Specific Gravity, Urine <1.005 (*)    Leukocytes, UA TRACE (*)    All other components within normal limits  URINE MICROSCOPIC-ADD ON - Abnormal; Notable for the following:    Squamous Epithelial / LPF TOO NUMEROUS TO COUNT (*)    Bacteria, UA MANY (*)    All other components within normal limits  LIPASE, BLOOD  CBC  PREGNANCY, URINE    Imaging Review No results found. I have personally reviewed and evaluated these images and lab results as part of my medical decision-making.   EKG Interpretation None      MDM   Final diagnoses:  None    Labs unremarkable CT scan unremarkable. Suspect gastritis patient will continue with her protonic Carafate Zofran and follow-up with her GI doctor  The chart was scribed for me under my direct supervision.  I personally  performed the history, physical, and medical decision making and all procedures in the evaluation of this patient.Bethann Berkshire, MD 06/27/15 [phone]

## 2015-06-27 NOTE — Discharge Instructions (Signed)
Call Dr. Genia Deloark's office tomorrow and let them know that she had a CAT scan today and your seen in emergency room for vomiting blood again

## 2015-06-27 NOTE — Telephone Encounter (Signed)
Pt called back and stated that she had to go to work and wanted to know which ER she should go to. I told her that we could see everything from [facility] but she should go to which ever one is closer for her

## 2015-06-28 ENCOUNTER — Telehealth: Payer: Self-pay | Admitting: Internal Medicine

## 2015-06-28 ENCOUNTER — Encounter: Payer: Self-pay | Admitting: Internal Medicine

## 2015-06-28 ENCOUNTER — Ambulatory Visit (HOSPITAL_COMMUNITY): Payer: Medicaid - Out of State

## 2015-06-28 NOTE — Telephone Encounter (Signed)
Pt is aware.  

## 2015-06-28 NOTE — Telephone Encounter (Signed)
Office visit made, letter mailed

## 2015-06-28 NOTE — Telephone Encounter (Signed)
noted 

## 2015-06-28 NOTE — Telephone Encounter (Signed)
Pt called back and wanted to know if we could fax over a note to her PCP saying that it was ok for them to give her pain medication from our stand point. She also wants to know if we can send a referral to the pain clinic in [place] since no one in [place] will take her insurance.

## 2015-06-28 NOTE — Telephone Encounter (Signed)
Pt is aware of LSL recommendations. See other phone note

## 2015-06-28 NOTE — Telephone Encounter (Signed)
Please schedule ov next week

## 2015-06-28 NOTE — Telephone Encounter (Signed)
Pt's boyfriend called today around 12 noon to say that the patient has called us several times this morning and no one has called her back. He said that he is really worried about her and her pain level and she was "losing faith".  I told the boyfriend that we were aware of patient's concerns and we have pending phone notes and were waiting for the recommendations from the provider. I told him that we have been seeing patients this morning and someone would be calling the patient back.

## 2015-06-28 NOTE — Telephone Encounter (Signed)
Pt called this morning to let us know that she went to the ER last night and she's scheduled for a CT this afternoon and wanted to know what she should do next. I told her that the provider would probably need to see the CT results first and make recommendations from there. She is on the recall list for June to have a PP FU.  Patient is also asking for us to send in a prescription for dizziness and pain to [facility]. Please advise [phone]

## 2015-06-28 NOTE — Telephone Encounter (Signed)
Multiple phone notes open on this patient. I am no longer able to provide referral to pain clinic. She can discuss with her PCP.  I cannot send letter to PCP recommending pain medication from GI standpoint, she has chronic back pain which is not GI related. Concerns for drug seeking behavior.

## 2015-06-28 NOTE — Telephone Encounter (Signed)
Pt's mother called asking to speak with the nurse. Patient is scheduled a CT this afternoon and patient's throat is raw. I told the mother that patient had already called earlier and there's a note for the nurse. Mother is wanting to speak with the nurse and I told her that the nurse was with patients and she was also filling in for another nurse and I could transfer her call to VM. Mother agreed.

## 2015-06-28 NOTE — Telephone Encounter (Signed)
Pt is aware of what LSL said 

## 2015-06-28 NOTE — Telephone Encounter (Signed)
Reviewed ER records, labs and CT. CT shows that her right kidney is atrophied. No comparison studies, so not sure if she was born this way or not. Labs do not explain her symptoms, her Hgb is normal.  1. Recommend continue pantoprazole BID. Continue Carafate. Zofran TID for nausea, if she needs RX let me know. 2. NO ASA POWDERS 3. Needs to see PCP regarding right renal atrophy. Send copy of CT. 4. No medications for dizziness or pain to be prescribed. 5. Return OV next week, RMR if available.

## 2015-06-28 NOTE — Telephone Encounter (Signed)
With recent information provided by patient's mother, see note dated 06/28/15, we will not be sending her to pain clinic. She can address this further with her PCP.

## 2015-06-28 NOTE — Telephone Encounter (Signed)
cc'd  CT results to pcp

## 2015-06-28 NOTE — Telephone Encounter (Signed)
Pt is aware to cancel CT scan for today since she went to the ER last night and they did one there

## 2015-06-28 NOTE — Telephone Encounter (Signed)
Spoke with the pts mother (Ms Cliffton AstersWhite)- informed her that without the patients consent I could not give out any of her information. She said she understood that, she just wanted to make sure we were aware that her daughter is an addict. She said she crushes and snorts any pills she can get (pain pills, nerve pills, ADHD pills) she said the pt has 2 children who are on ADHD meds and she knows she is crushing and snorting them. Pt lives with her mother and the mother has custody of the children. She said she tries to keep the meds locked up but that doesn't happen all the time.   Pt went to ED last night and had a CT done. She is also scheduled for a CT at 1:45 today.

## 2015-06-29 NOTE — Telephone Encounter (Signed)
Pt gave me a Dr.Winikur at [facility] in [place]. Phone # [phone]. Do you want us to send her there?

## 2015-06-29 NOTE — Telephone Encounter (Signed)
Pt called this morning to let us know that her PCP would not refer her to a pain clinic. She also stated that they told her that if we could refer her to a pain clinic in [place] then we could refer her to one in [place]. She stated that she is hurting and need help. Please advise

## 2015-06-29 NOTE — Telephone Encounter (Signed)
[place] accessed [place] controlled [place]. She gets vicodin #20 per month from her PCP. She would have to provide us with [place] information as we do not generally refer to that region.

## 2015-06-29 NOTE — Telephone Encounter (Signed)
I called Dr.Winikur's office and they don't take Medicaid.

## 2015-06-30 LAB — URINE CULTURE

## 2015-06-30 MED ORDER — ONDANSETRON 4 MG PO TBDP: 4.0000 mg | ORAL_TABLET | Freq: Three times a day (TID) | ORAL | Status: DC | PRN

## 2015-06-30 MED ORDER — DICYCLOMINE HCL 10 MG PO CAPS: 10.0000 mg | ORAL_CAPSULE | Freq: Three times a day (TID) | ORAL | Status: DC

## 2015-06-30 NOTE — Telephone Encounter (Signed)
I don't have anything else to offer regarding this matter. If she can locate a pain clinic that accepts her insurance, then we can send a referral.

## 2015-06-30 NOTE — Telephone Encounter (Signed)
Pt called this morning requesting a RX for zofran and something for her diarrhea. Please advise

## 2015-06-30 NOTE — Telephone Encounter (Signed)
done

## 2015-06-30 NOTE — Addendum Note (Signed)
Addended by: Davionne KocherLEWIS, Kaybree Williams S on: 06/30/2015 10:42 AM   Modules accepted: Orders

## 2015-06-30 NOTE — Telephone Encounter (Signed)
Called pt and spoke with family member. Told them she has some Rx to pick up

## 2015-07-01 ENCOUNTER — Telehealth: Payer: Self-pay | Admitting: Internal Medicine

## 2015-07-01 NOTE — Telephone Encounter (Signed)
Bentyl is for her diarrhea.

## 2015-07-01 NOTE — Telephone Encounter (Signed)
Routing to LSL 

## 2015-07-01 NOTE — Telephone Encounter (Signed)
Pt called to say she needs a prescription for diarrhea called in for her. She said that she had prescriptions of zofran and dicyclomine already.

## 2015-07-04 NOTE — Telephone Encounter (Signed)
Noted  

## 2015-07-04 NOTE — Progress Notes (Signed)
Quick Note:  She has appointment 07/08/15 with RMR she MUST keep. ______

## 2015-07-05 NOTE — Telephone Encounter (Signed)
Pt called this morning and she is aware that we can not find anyone that takes her insurance for the pain clinic referral. She is going to call her insurance to see who in on her plan.

## 2015-07-06 NOTE — Telephone Encounter (Signed)
Pt is aware. She said it hasnt helped. She is aware of her appt Friday with RMR.

## 2015-07-06 NOTE — Telephone Encounter (Signed)
Tried to call pt- NA 

## 2015-07-06 NOTE — Telephone Encounter (Signed)
Routing to LSL for fyi

## 2015-07-06 NOTE — Telephone Encounter (Signed)
Noted. She can increase bentyl to 20mg  qac/qhs.

## 2015-07-07 NOTE — Telephone Encounter (Signed)
Tried to call pt- NA 

## 2015-07-08 ENCOUNTER — Ambulatory Visit (INDEPENDENT_AMBULATORY_CARE_PROVIDER_SITE_OTHER): Payer: PRIVATE HEALTH INSURANCE | Admitting: Internal Medicine

## 2015-07-08 ENCOUNTER — Encounter: Payer: Self-pay | Admitting: Internal Medicine

## 2015-07-08 VITALS — BP 119/80 | HR 100 | Temp 96.9°F | Ht 66.0 in | Wt 160.2 lb

## 2015-07-08 DIAGNOSIS — R112 Nausea with vomiting, unspecified: Secondary | ICD-10-CM | POA: Diagnosis not present

## 2015-07-08 NOTE — Telephone Encounter (Signed)
Pt seeing RMR today
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
## 2015-07-08 NOTE — Patient Instructions (Addendum)
You were not examined today by me at your request. I respect your wishes.  Although you have had an extensive evaluation, you may need further testing to fully evaluate all of your symptoms.    You do have a small umbilical hernia which may be causing you some discomfort from time to time.  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 This is likely chronic and has been that way for a long time. Your serum creatinine, a blood measurement of kidney function) is abnormal  I recommend you follow-up your primary care physician regarding these abnormalities.

## 2015-07-11 ENCOUNTER — Encounter: Payer: Self-pay | Admitting: General Practice

## 2015-07-11 ENCOUNTER — Telehealth: Payer: Self-pay | Admitting: General Practice

## 2015-07-11 NOTE — Telephone Encounter (Signed)
Patient said she respects RMR and she really likes him and would like to continue her care with him

## 2015-07-11 NOTE — Telephone Encounter (Signed)
Routing to Dr. Rourk as a FYI  

## 2015-07-11 NOTE — Telephone Encounter (Signed)
I tried to call the patient, no answer,lmom 

## 2015-07-11 NOTE — Telephone Encounter (Signed)
The patient went on to say that she was told by Nicole([phone]) with [facility](managed care organization for [facility] that we were not credentialed with them.  [place] and we are not credentialed with them.  They will like for her to transition to a GI practice within her network or they will not pay for her claims.

## 2015-07-11 NOTE — Telephone Encounter (Signed)
Patient had concerns about that we were not in network with her insurance company([place] [employer])

## 2015-07-11 NOTE — Telephone Encounter (Signed)
I spoke with the patient and she stated she will be choosing another GI doctor from her insurance company preferred list.  I explained to the patient that we will terminate our doctor patient relationship, she voiced understanding and the call was disconnected.

## 2015-07-11 NOTE — Telephone Encounter (Signed)
Communications noted 

## 2015-07-11 NOTE — Telephone Encounter (Signed)
Discharge letter mailed  

## 2015-07-11 NOTE — Telephone Encounter (Signed)
I spoke with the patient and made her aware that she will need to see RMR at some point and that she will be willing to have him perform her exam if she needs to.  She like RMR and would love to stay with him

## 2015-07-23 ENCOUNTER — Encounter (HOSPITAL_COMMUNITY): Payer: Self-pay

## 2015-07-23 ENCOUNTER — Emergency Department (HOSPITAL_COMMUNITY)
Admission: EM | Admit: 2015-07-23 | Discharge: 2015-07-23 | Disposition: A | Discharge status: Home / Self Care | Payer: Medicaid - Out of State | Attending: Emergency Medicine | Admitting: Emergency Medicine

## 2015-07-23 DIAGNOSIS — F17210 Nicotine dependence, cigarettes, uncomplicated: Secondary | ICD-10-CM | POA: Diagnosis not present

## 2015-07-23 DIAGNOSIS — R109 Unspecified abdominal pain: Secondary | ICD-10-CM | POA: Diagnosis not present

## 2015-07-23 DIAGNOSIS — K219 Gastro-esophageal reflux disease without esophagitis: Secondary | ICD-10-CM | POA: Insufficient documentation

## 2015-07-23 DIAGNOSIS — Z79899 Other long term (current) drug therapy: Secondary | ICD-10-CM | POA: Diagnosis not present

## 2015-07-23 DIAGNOSIS — R000 Tachycardia, unspecified: Secondary | ICD-10-CM | POA: Diagnosis not present

## 2015-07-23 DIAGNOSIS — Z7951 Long term (current) use of inhaled steroids: Secondary | ICD-10-CM | POA: Diagnosis not present

## 2015-07-23 DIAGNOSIS — F4310 Post-traumatic stress disorder, unspecified: Secondary | ICD-10-CM | POA: Insufficient documentation

## 2015-07-23 DIAGNOSIS — G8929 Other chronic pain: Secondary | ICD-10-CM | POA: Insufficient documentation

## 2015-07-23 LAB — COMPREHENSIVE METABOLIC PANEL
ALT: 13 U/L — ABNORMAL LOW (ref 14–54)
AST: 19 U/L (ref 15–41)
Albumin: 3.5 g/dL (ref 3.5–5.0)
Alkaline Phosphatase: 103 U/L (ref 38–126)
Anion gap: 11 (ref 5–15)
CHLORIDE: 107 mmol/L (ref 101–111)
CO2: 20 mmol/L — ABNORMAL LOW (ref 22–32)
Calcium: 8.8 mg/dL — ABNORMAL LOW (ref 8.9–10.3)
Creatinine, Ser: 0.91 mg/dL (ref 0.44–1.00)
Glucose, Bld: 75 mg/dL (ref 65–99)
POTASSIUM: 4.3 mmol/L (ref 3.5–5.1)
Sodium: 138 mmol/L (ref 135–145)
Total Bilirubin: 0.4 mg/dL (ref 0.3–1.2)
Total Protein: 6.4 g/dL — ABNORMAL LOW (ref 6.5–8.1)

## 2015-07-23 LAB — URINALYSIS, ROUTINE W REFLEX MICROSCOPIC
Bilirubin Urine: NEGATIVE
GLUCOSE, UA: NEGATIVE mg/dL
Hgb urine dipstick: NEGATIVE
Ketones, ur: NEGATIVE mg/dL
LEUKOCYTES UA: NEGATIVE
Nitrite: NEGATIVE
PH: 6.5 (ref 5.0–8.0)
Protein, ur: NEGATIVE mg/dL
SPECIFIC GRAVITY, URINE: 1.009 (ref 1.005–1.030)

## 2015-07-23 LAB — CBC
HEMATOCRIT: 38.4 % (ref 36.0–46.0)
Hemoglobin: 11.9 g/dL — ABNORMAL LOW (ref 12.0–15.0)
MCH: 27.2 pg (ref 26.0–34.0)
MCHC: 31 g/dL (ref 30.0–36.0)
MCV: 87.7 fL (ref 78.0–100.0)
Platelets: 260 10*3/uL (ref 150–400)
RBC: 4.38 MIL/uL (ref 3.87–5.11)
RDW: 14.7 % (ref 11.5–15.5)
WBC: 8.9 10*3/uL (ref 4.0–10.5)

## 2015-07-23 LAB — LIPASE, BLOOD: LIPASE: 19 U/L (ref 11–51)

## 2015-07-23 LAB — POC OCCULT BLOOD, ED: Fecal Occult Bld: NEGATIVE

## 2015-07-23 LAB — I-STAT TROPONIN, ED: TROPONIN I, POC: 0 ng/mL (ref 0.00–0.08)

## 2015-07-23 LAB — I-STAT BETA HCG BLOOD, ED (MC, WL, AP ONLY): I-stat hCG, quantitative: <5 m[IU]/mL (ref <5)

## 2015-07-23 MED ORDER — ONDANSETRON HCL 4 MG/2ML IJ SOLN
4.0000 mg | Freq: Once | INTRAMUSCULAR | Status: AC | PRN
  Administered 2015-07-23: 4 mg via INTRAVENOUS
  Filled 2015-07-23: qty 2

## 2015-07-23 MED ORDER — LIDOCAINE VISCOUS 2 % MT SOLN
15.0000 mL | Freq: Once | OROMUCOSAL | Status: AC
  Administered 2015-07-23: 15 mL via OROMUCOSAL
  Filled 2015-07-23: qty 15

## 2015-07-23 MED ORDER — ALUM & MAG HYDROXIDE-SIMETH 200-200-20 MG/5ML PO SUSP
30.0000 mL | Freq: Once | ORAL | Status: DC
  Filled 2015-07-23: qty 30

## 2015-07-23 MED ORDER — SODIUM CHLORIDE 0.9 % IV BOLUS (SEPSIS)
1000.0000 mL | Freq: Once | INTRAVENOUS | Status: AC
Start: 2015-07-23 — End: 2015-07-23
  Administered 2015-07-23: 1000 mL via INTRAVENOUS

## 2015-07-23 NOTE — ED Notes (Signed)
Pt in room getting dressed into gown

## 2015-07-23 NOTE — Discharge Instructions (Signed)

## 2015-07-23 NOTE — ED Notes (Signed)
Pt reports she took Protonix and Maalox this morning with no relief.  Would like to try something other than Maalox for pain.
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
## 2015-07-23 NOTE — ED Notes (Signed)
Patient here with ongoing generalized abdominal pain with vomiting and diarrhea. Reports that she has had endoscopy, colonoscopy, CT for this ongoing pain with no diagnosis. Reports that the pain restarted this am with some blood in emesis and the pain.

## 2015-07-24 ENCOUNTER — Encounter (HOSPITAL_COMMUNITY): Payer: Self-pay | Admitting: Emergency Medicine

## 2015-07-24 ENCOUNTER — Encounter (HOSPITAL_BASED_OUTPATIENT_CLINIC_OR_DEPARTMENT_OTHER): Payer: Self-pay

## 2015-07-24 ENCOUNTER — Emergency Department (HOSPITAL_COMMUNITY)
Admission: EM | Admit: 2015-07-24 | Discharge: 2015-07-24 | Disposition: A | Discharge status: Home / Self Care | Payer: Medicaid - Out of State | Attending: Emergency Medicine | Admitting: Emergency Medicine

## 2015-07-24 DIAGNOSIS — K2970 Gastritis, unspecified, without bleeding: Secondary | ICD-10-CM | POA: Diagnosis not present

## 2015-07-24 DIAGNOSIS — F17210 Nicotine dependence, cigarettes, uncomplicated: Secondary | ICD-10-CM | POA: Insufficient documentation

## 2015-07-24 DIAGNOSIS — F319 Bipolar disorder, unspecified: Secondary | ICD-10-CM | POA: Insufficient documentation

## 2015-07-24 DIAGNOSIS — Z79899 Other long term (current) drug therapy: Secondary | ICD-10-CM | POA: Diagnosis not present

## 2015-07-24 DIAGNOSIS — K920 Hematemesis: Secondary | ICD-10-CM | POA: Diagnosis present

## 2015-07-24 LAB — CBC WITH DIFFERENTIAL/PLATELET
BASOS ABS: 0 10*3/uL (ref 0.0–0.1)
Basophils Relative: 0 %
EOS PCT: 2 %
Eosinophils Absolute: 0.1 10*3/uL (ref 0.0–0.7)
HEMATOCRIT: 40 % (ref 36.0–46.0)
Hemoglobin: 12.7 g/dL (ref 12.0–15.0)
LYMPHS ABS: 1.5 10*3/uL (ref 0.7–4.0)
LYMPHS PCT: 22 %
MCH: 28.1 pg (ref 26.0–34.0)
MCHC: 31.8 g/dL (ref 30.0–36.0)
MCV: 88.5 fL (ref 78.0–100.0)
MONO ABS: 0.4 10*3/uL (ref 0.1–1.0)
MONOS PCT: 6 %
NEUTROS ABS: 4.8 10*3/uL (ref 1.7–7.7)
Neutrophils Relative %: 70 %
PLATELETS: 277 10*3/uL (ref 150–400)
RBC: 4.52 MIL/uL (ref 3.87–5.11)
RDW: 14.8 % (ref 11.5–15.5)
WBC: 6.8 10*3/uL (ref 4.0–10.5)

## 2015-07-24 LAB — BASIC METABOLIC PANEL
ANION GAP: 6 (ref 5–15)
CALCIUM: 9.4 mg/dL (ref 8.9–10.3)
CO2: 21 mmol/L — AB (ref 22–32)
Chloride: 112 mmol/L — ABNORMAL HIGH (ref 101–111)
Creatinine, Ser: 0.94 mg/dL (ref 0.44–1.00)
GFR calc Af Amer: >60 mL/min (ref >60)
GLUCOSE: 96 mg/dL (ref 65–99)
Potassium: 4.4 mmol/L (ref 3.5–5.1)
Sodium: 139 mmol/L (ref 135–145)

## 2015-07-24 LAB — HEPATIC FUNCTION PANEL
ALBUMIN: 3.9 g/dL (ref 3.5–5.0)
ALT: 12 U/L — AB (ref 14–54)
AST: 19 U/L (ref 15–41)
Alkaline Phosphatase: 97 U/L (ref 38–126)
BILIRUBIN TOTAL: 0.2 mg/dL — AB (ref 0.3–1.2)
Bilirubin, Direct: <0.1 mg/dL — ABNORMAL LOW (ref 0.1–0.5)
TOTAL PROTEIN: 7.3 g/dL (ref 6.5–8.1)

## 2015-07-24 MED ORDER — SODIUM CHLORIDE 0.9 % IV BOLUS (SEPSIS)
1000.0000 mL | Freq: Once | INTRAVENOUS | Status: AC
  Administered 2015-07-24: 1000 mL via INTRAVENOUS

## 2015-07-24 MED ORDER — FAMOTIDINE IN NACL 20-0.9 MG/50ML-% IV SOLN
INTRAVENOUS | Status: AC
  Filled 2015-07-24: qty 50

## 2015-07-24 MED ORDER — ONDANSETRON HCL 4 MG/2ML IJ SOLN
4.0000 mg | Freq: Once | INTRAMUSCULAR | Status: AC
  Administered 2015-07-24: 4 mg via INTRAVENOUS
  Filled 2015-07-24: qty 2

## 2015-07-24 MED ORDER — ONDANSETRON 4 MG PO TBDP: ORAL_TABLET | ORAL | Status: DC

## 2015-07-24 MED ORDER — TRAMADOL HCL 50 MG PO TABS: 50.0000 mg | ORAL_TABLET | Freq: Four times a day (QID) | ORAL | Status: DC | PRN

## 2015-07-24 MED ORDER — HYDROMORPHONE HCL 1 MG/ML IJ SOLN
0.5000 mg | Freq: Once | INTRAMUSCULAR | Status: AC
  Administered 2015-07-24: 0.5 mg via INTRAVENOUS
  Filled 2015-07-24: qty 1

## 2015-07-24 MED ORDER — FAMOTIDINE IN NACL 20-0.9 MG/50ML-% IV SOLN
20.0000 mg | Freq: Once | INTRAVENOUS | Status: AC
  Administered 2015-07-24: 20 mg via INTRAVENOUS

## 2015-07-24 NOTE — Discharge Instructions (Signed)
Follow-up with your family doctor next week also find a GI doctor to follow-up with

## 2015-07-24 NOTE — ED Notes (Signed)
Pt reports blood in emesis in stool since this morning.  Pt describes stool as black.  Pt vomited bright red blood.  Pt also has abdominal pain in epigastric area, and left lower quadrant.  Pt also has swelling in bilateral ankles and a headache.

## 2015-07-24 NOTE — ED Notes (Signed)
[facility]
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
## 2015-07-24 NOTE — ED Notes (Signed)
MD at bedside. 

## 2015-08-03 ENCOUNTER — Telehealth: Payer: Self-pay

## 2015-08-03 NOTE — Telephone Encounter (Signed)
Pt called and would like for us to send her information to a Dr. Rubye Oaksailey in [place] for the pain clinic. His hone number is [phone]. Please advise  If we are able to do this?

## 2015-08-03 NOTE — Telephone Encounter (Signed)
She can sign a release there and have them fax it to our office and we will send her records.

## 2015-08-03 NOTE — Telephone Encounter (Signed)
I tried to call the patient and make her aware of her options, however she didn't answer so I lmom for her to call back

## 2015-08-10 ENCOUNTER — Emergency Department (HOSPITAL_COMMUNITY)
Admission: EM | Admit: 2015-08-10 | Discharge: 2015-08-10 | Disposition: A | Discharge status: Home / Self Care | Payer: Medicaid - Out of State | Attending: Emergency Medicine | Admitting: Emergency Medicine

## 2015-08-10 ENCOUNTER — Encounter (HOSPITAL_COMMUNITY): Payer: Self-pay | Admitting: Emergency Medicine

## 2015-08-10 ENCOUNTER — Emergency Department (HOSPITAL_COMMUNITY): Payer: Medicaid - Out of State

## 2015-08-10 DIAGNOSIS — R112 Nausea with vomiting, unspecified: Secondary | ICD-10-CM | POA: Insufficient documentation

## 2015-08-10 DIAGNOSIS — F319 Bipolar disorder, unspecified: Secondary | ICD-10-CM | POA: Insufficient documentation

## 2015-08-10 DIAGNOSIS — Z79899 Other long term (current) drug therapy: Secondary | ICD-10-CM | POA: Insufficient documentation

## 2015-08-10 DIAGNOSIS — R1031 Right lower quadrant pain: Secondary | ICD-10-CM | POA: Diagnosis present

## 2015-08-10 DIAGNOSIS — R102 Pelvic and perineal pain: Secondary | ICD-10-CM | POA: Diagnosis not present

## 2015-08-10 DIAGNOSIS — F17210 Nicotine dependence, cigarettes, uncomplicated: Secondary | ICD-10-CM | POA: Diagnosis not present

## 2015-08-10 DIAGNOSIS — R197 Diarrhea, unspecified: Secondary | ICD-10-CM | POA: Insufficient documentation

## 2015-08-10 DIAGNOSIS — Z79891 Long term (current) use of opiate analgesic: Secondary | ICD-10-CM | POA: Diagnosis not present

## 2015-08-10 LAB — COMPREHENSIVE METABOLIC PANEL
ALBUMIN: 3.7 g/dL (ref 3.5–5.0)
ALK PHOS: 83 U/L (ref 38–126)
ALT: 17 U/L (ref 14–54)
AST: 23 U/L (ref 15–41)
Anion gap: 2 — ABNORMAL LOW (ref 5–15)
CALCIUM: 8.7 mg/dL — AB (ref 8.9–10.3)
CO2: 23 mmol/L (ref 22–32)
CREATININE: 0.87 mg/dL (ref 0.44–1.00)
Chloride: 109 mmol/L (ref 101–111)
GFR calc non Af Amer: >60 mL/min (ref >60)
GLUCOSE: 99 mg/dL (ref 65–99)
Potassium: 3.8 mmol/L (ref 3.5–5.1)
SODIUM: 134 mmol/L — AB (ref 135–145)
Total Bilirubin: 0.4 mg/dL (ref 0.3–1.2)
Total Protein: 6.7 g/dL (ref 6.5–8.1)

## 2015-08-10 LAB — URINALYSIS, ROUTINE W REFLEX MICROSCOPIC
BILIRUBIN URINE: NEGATIVE
Glucose, UA: NEGATIVE mg/dL
HGB URINE DIPSTICK: NEGATIVE
Ketones, ur: NEGATIVE mg/dL
Leukocytes, UA: NEGATIVE
Nitrite: NEGATIVE
PROTEIN: NEGATIVE mg/dL
Specific Gravity, Urine: <1.005 — ABNORMAL LOW (ref 1.005–1.030)
pH: 7 (ref 5.0–8.0)

## 2015-08-10 LAB — CBC
HCT: 36.7 % (ref 36.0–46.0)
Hemoglobin: 12 g/dL (ref 12.0–15.0)
MCH: 29.1 pg (ref 26.0–34.0)
MCHC: 32.7 g/dL (ref 30.0–36.0)
MCV: 88.9 fL (ref 78.0–100.0)
PLATELETS: 262 10*3/uL (ref 150–400)
RBC: 4.13 MIL/uL (ref 3.87–5.11)
RDW: 15.5 % (ref 11.5–15.5)
WBC: 8.8 10*3/uL (ref 4.0–10.5)

## 2015-08-10 LAB — PREGNANCY, URINE: PREG TEST UR: NEGATIVE

## 2015-08-10 MED ORDER — ONDANSETRON HCL 4 MG/2ML IJ SOLN
4.0000 mg | Freq: Once | INTRAMUSCULAR | Status: AC
  Administered 2015-08-10: 4 mg via INTRAVENOUS
  Filled 2015-08-10: qty 2

## 2015-08-10 MED ORDER — MORPHINE SULFATE (PF) 4 MG/ML IV SOLN
4.0000 mg | Freq: Once | INTRAVENOUS | Status: AC
  Administered 2015-08-10: 4 mg via INTRAVENOUS
  Filled 2015-08-10: qty 1

## 2015-08-10 MED ORDER — MORPHINE SULFATE (PF) 4 MG/ML IV SOLN
INTRAVENOUS | Status: AC
  Filled 2015-08-10: qty 1

## 2015-08-10 MED ORDER — MORPHINE SULFATE (PF) 2 MG/ML IV SOLN
2.0000 mg | Freq: Once | INTRAVENOUS | Status: AC
  Administered 2015-08-10: 2 mg via INTRAVENOUS
  Filled 2015-08-10: qty 1

## 2015-08-10 NOTE — ED Notes (Signed)
Pt out of bed ambulating to restroom without difficulty.

## 2015-08-10 NOTE — ED Notes (Signed)
Pt reports she is still in pain, that the morphine did not help.  Request pain medicine that starts with the letter "D" Resident notified.

## 2015-08-10 NOTE — ED Notes (Signed)
Pt out smoking when called to treatment

## 2015-08-10 NOTE — Discharge Instructions (Signed)
We did a CT of your pelvis and abdomen and did not see any reason for your pain. We also did a transvaginal ultrasound that was normal- we did not see any issues with your ovaries. If you continue to have symptoms, I would follow up with your GI doctor and gynecologist. If your symptoms worsen or change, seek medical care immediately.  Abdominal Pain, Adult Many things can cause abdominal pain. Usually, abdominal pain is not caused by a disease and will improve without treatment. It can often be observed and treated at home. Your health care provider will do a physical exam and possibly order blood tests and X-rays to help determine the seriousness of your pain. However, in many cases, more time must pass before a clear cause of the pain can be found. Before that point, your health care provider may not know if you need more testing or further treatment. HOME CARE INSTRUCTIONS Monitor your abdominal pain for any changes. The following actions may help to alleviate any discomfort you are experiencing:  Only take over-the-counter or prescription medicines as directed by your health care provider.  Do not take laxatives unless directed to do so by your health care provider.  Try a clear liquid diet (broth, tea, or water) as directed by your health care provider. Slowly move to a bland diet as tolerated. SEEK MEDICAL CARE IF:  You have unexplained abdominal pain.  You have abdominal pain associated with nausea or diarrhea.  You have pain when you urinate or have a bowel movement.  You experience abdominal pain that wakes you in the night.  You have abdominal pain that is worsened or improved by eating food.  You have abdominal pain that is worsened with eating fatty foods.  You have a fever. SEEK IMMEDIATE MEDICAL CARE IF:  Your pain does not go away within 2 hours.  You keep throwing up (vomiting).  Your pain is felt only in portions of the abdomen, such as the right side or the left  lower portion of the abdomen.  You pass bloody or black tarry stools. MAKE SURE YOU:  Understand these instructions.  Will watch your condition.  Will get help right away if you are not doing well or get worse.   This information is not intended to replace advice given to you by your health care provider. Make sure you discuss any questions you have with your health care provider.   Document Released: 11/22/2004 Document Revised: 11/03/2014 Document Reviewed: 10/22/2012 Elsevier Interactive Patient Education [employer]2016 [employer]

## 2015-08-10 NOTE — ED Provider Notes (Signed)
CSN: [id number]     Arrival date & time 08/10/15  1116 History   None    Chief Complaint  Patient presents with  . Abdominal Pain     (Consider location/radiation/quality/duration/timing/severity/associated sxs/prior Treatment) HPI  Kristine Vaughn is a 35 year old female with a past history of chronic abdominal pain, GERD, and IBS presenting with abdominal pain that started around 5pm yesterday evening.   She notes that her abdominal pain is mostly right-sided, and suprapubic in nature. It started last night and is very sharp, and feels like "something pulls down into her groin". She denies any pain like this in the past. She notes that the pain is constant. Her pain was actually worsened with urination. She denies any dysuria, immaturity or urinary frequency. She does note urinary urgency. She at baseline has diarrhea, and this is not changeed. She endorses nausea with one episode of emesis last night. She denies any melena, hematochezia, or hematemesis. She denies any vaginal discharge, vaginal pain, or pain with intercourse.  She notes that she has a ultrasound scheduled with her gynecologist tomorrow at 1 PM.  Past Medical History  Diagnosis Date  . GERD (gastroesophageal reflux disease)   . PTSD (post-traumatic stress disorder)   . IBS (irritable bowel syndrome)   . Migraines   . Bipolar disorder (HCC)   . Chronic back pain    Past Surgical History  Procedure Laterality Date  . None to date  03/28/15  . Myringotomy with tube placement    . Cervical cone biopsy    . Colonoscopy with propofol N/A 04/25/2015    VHQ:IONGEXBM hemorrhoids otherwise norma; including normal distal TI  . Esophagogastroduodenoscopy (egd) with propofol N/A 04/25/2015    WUX:[id number] reflux esophagitis and  abnormal gastric mucosa s/p bx (reactive gastritis/chemical gastritis)  . Biopsy  04/25/2015    Procedure: BIOPSY;  Surgeon: Corbin Ade, MD;  Location: [facility];  Service: Endoscopy;;  . Esophageal  dilation  04/25/2015    Procedure: ESOPHAGEAL DILATION;  Surgeon: Corbin Ade, MD;  Location: [facility];  Service: Endoscopy;;   Family History  Problem Relation Age of Onset  . Colon cancer Neg Hx   . Celiac disease Neg Hx   . Inflammatory bowel disease Neg Hx    Social History  Substance Use Topics  . Smoking status: Current Every Day Smoker -- 0.50 packs/day for 17 years    Types: Cigarettes  . Smokeless tobacco: Never Used  . Alcohol Use: No   OB History    No data available     Review of Systems  Constitutional: Positive for chills and appetite change. Negative for fever, diaphoresis, activity change, fatigue and unexpected weight change.  HENT: Negative.   Eyes: Negative.   Respiratory: Negative for cough, choking, shortness of breath, wheezing and stridor.   Cardiovascular: Negative for chest pain, palpitations and leg swelling.  Gastrointestinal: Positive for nausea, vomiting, abdominal pain and diarrhea. Negative for constipation, blood in stool, abdominal distention and anal bleeding.  Endocrine: Negative.   Genitourinary: Positive for urgency and pelvic pain. Negative for dysuria, frequency, hematuria, flank pain, decreased urine volume, vaginal discharge, difficulty urinating, vaginal pain and dyspareunia.  Musculoskeletal: Negative.   Skin: Negative for rash.  Allergic/Immunologic: Negative.   Neurological: Negative for dizziness, tremors, seizures, speech difficulty, weakness, light-headedness, numbness and headaches.  Hematological: Negative.   Psychiatric/Behavioral: Negative for hallucinations. The patient is not nervous/anxious.       Allergies  Levsin and Meclizine  Home Medications  Prior to Admission medications   Medication Sig Start Date End Date Taking? Authorizing Provider  amitriptyline (ELAVIL) 10 MG tablet Take 10 mg by mouth at bedtime.   Yes Historical Provider, MD  buPROPion (WELLBUTRIN SR) 150 MG 12 hr tablet Take 450 mg by mouth  daily. Take 3 tabs every morning.   Yes Historical Provider, MD  cetirizine (ZYRTEC) 10 MG tablet Take 10 mg by mouth daily.    Yes Historical Provider, MD  clonazePAM (KLONOPIN) 1 MG tablet Take 1 mg by mouth 3 (three) times daily.    Yes Historical Provider, MD  dicyclomine (BENTYL) 10 MG capsule Take 1 capsule (10 mg total) by mouth 4 (four) times daily -  before meals and at bedtime. 06/30/15  Yes Liyanna Kocher, PA-C  HYDROcodone-acetaminophen (NORCO/VICODIN) 5-325 MG tablet Take 1 tablet by mouth daily as needed for moderate pain.  03/14/15  Yes Historical Provider, MD  IRON PO Take 1 tablet by mouth daily.   Yes Historical Provider, MD  levonorgestrel (MIRENA) 20 MCG/24HR IUD 1 each by Intrauterine route once.   Yes Historical Provider, MD  loperamide (IMODIUM) 2 MG capsule Take 2 mg by mouth as needed for diarrhea or loose stools.   Yes Historical Provider, MD  ondansetron (ZOFRAN ODT) 4 MG disintegrating tablet 4mg  ODT q4 hours prn nausea/vomit Patient taking differently: Take 4 mg by mouth 2 (two) times daily as needed for nausea or vomiting. 4mg  ODT q4 hours prn nausea/vomit 07/24/15  Yes Bethann Berkshire, MD  ondansetron (ZOFRAN-ODT) 8 MG disintegrating tablet Take 8 mg by mouth 2 (two) times daily as needed for nausea or vomiting.   Yes Historical Provider, MD  pantoprazole (PROTONIX) 40 MG tablet Take 1 tablet (40 mg total) by mouth 2 (two) times daily before a meal. 06/23/15  Yes Keary Kocher, PA-C  pramipexole (MIRAPEX) 0.5 MG tablet Take 0.5 mg by mouth at bedtime.   Yes Historical Provider, MD  propranolol (INDERAL) 60 MG tablet Take 60 mg by mouth 2 (two) times daily.   Yes Historical Provider, MD  ranitidine (ZANTAC) 150 MG capsule Take 150 mg by mouth at bedtime.   Yes Historical Provider, MD  risperiDONE (RISPERDAL) 3 MG tablet Take 6 mg by mouth at bedtime.   Yes Historical Provider, MD  rizatriptan (MAXALT) 10 MG tablet Take 10 mg by mouth as needed for migraine. May repeat in 2  hours if needed   Yes Historical Provider, MD  sucralfate (CARAFATE) 1 g tablet Take 1 tablet (1 g total) by mouth 4 (four) times daily -  with meals and at bedtime. Crush in applesauce 06/23/15  Yes Chattie Kocher, PA-C  topiramate (TOPAMAX) 100 MG tablet Take 100 mg by mouth 2 (two) times daily.   Yes Historical Provider, MD  traZODone (DESYREL) 100 MG tablet Take 50-300 mg by mouth at bedtime.    Yes Historical Provider, MD  trihexyphenidyl (ARTANE) 2 MG tablet Take 2 mg by mouth at bedtime.   Yes Historical Provider, MD  Turmeric Curcumin 500 MG CAPS Take 500 mg by mouth daily.   Yes Historical Provider, MD  venlafaxine (EFFEXOR) 25 MG tablet Take 25 mg by mouth 2 (two) times daily.   Yes Historical Provider, MD  traMADol (ULTRAM) 50 MG tablet Take 1 tablet (50 mg total) by mouth every 6 (six) hours as needed. Patient not taking: Reported on 08/10/2015 07/24/15   Bethann Berkshire, MD   BP 122/88 mmHg  Pulse 88  Temp(Src) 98.1 F (  36.7 C) (Oral)  Resp 18  Ht 5\' 6"  (1.676 m)  Wt 75.297 kg  BMI 26.81 kg/m2  SpO2 98% Physical Exam  Constitutional: She is oriented to person, place, and time. She appears well-developed and well-nourished. No distress.  HENT:  Head: Normocephalic.  Mouth/Throat: Oropharynx is clear and moist. No oropharyngeal exudate.  Eyes: Conjunctivae are normal. No scleral icterus.  Neck: Neck supple. No JVD present.  Cardiovascular: Normal rate, normal heart sounds and intact distal pulses.  Exam reveals no gallop and no friction rub.   No murmur heard. Pulmonary/Chest: Effort normal and breath sounds normal. No respiratory distress. She has no wheezes. She has no rales. She exhibits no tenderness.  Abdominal: Soft. Bowel sounds are normal. She exhibits no distension. There is tenderness. There is no rebound and no guarding.  Mild tenderness in the right lower quadrant and suprapubic region.  Musculoskeletal: She exhibits no edema or tenderness.  Neurological: She is  alert and oriented to person, place, and time. She exhibits normal muscle tone.  Skin: No rash noted. She is not diaphoretic.  Psychiatric: She has a normal mood and affect.    ED Course  Procedures (including critical care time) Labs Review Labs Reviewed  COMPREHENSIVE METABOLIC PANEL - Abnormal; Notable for the following:    Sodium 134 (*)    BUN <5 (*)    Calcium 8.7 (*)    Anion gap 2 (*)    All other components within normal limits  URINALYSIS, ROUTINE W REFLEX MICROSCOPIC (NOT AT [facility]) - Abnormal; Notable for the following:    Color, Urine STRAW (*)    Specific Gravity, Urine <1.005 (*)    All other components within normal limits  CBC  PREGNANCY, URINE    Imaging Review US Transvaginal Non-ob  08/10/2015  CLINICAL DATA:  Right lower quadrant abdominal pain EXAM: TRANSABDOMINAL AND TRANSVAGINAL ULTRASOUND OF PELVIS DOPPLER ULTRASOUND OF OVARIES TECHNIQUE: Both transabdominal and transvaginal ultrasound examinations of the pelvis were performed. Transabdominal technique was performed for global imaging of the pelvis including uterus, ovaries, adnexal regions, and pelvic cul-de-sac. It was necessary to proceed with endovaginal exam following the transabdominal exam to visualize the ovaries and IUD. Color and duplex Doppler ultrasound was utilized to evaluate blood flow to the ovaries. COMPARISON:  Abdominal CT 06/27/2015 FINDINGS: Uterus Measurements: 7 x 3 x 5 cm. No fibroids or other mass visualized. Endometrium Thickness: 6 mm. IUD present and in unremarkable positioning. IUD shadowing obscures posterior endometrium at multiple levels. Right ovary Measurements: 33 x 15 x 18 mm. Normal appearance/no adnexal mass. Measured isoechoic structure is likely collapsing corpus luteum. Left ovary Measurements: 25 x 13 x 22 mm. Normal appearance/no adnexal mass. Pulsed Doppler evaluation of both ovaries demonstrates normal low-resistance arterial and venous waveforms. Other findings No abnormal  free fluid. IMPRESSION: 1. Negative for ovarian torsion or other cause of pain. 2. IUD in good position. Electronically Signed   By: Marnee Spring M.D.   On: 08/10/2015 14:50   US Pelvis Complete  08/10/2015  CLINICAL DATA:  Right lower quadrant abdominal pain EXAM: TRANSABDOMINAL AND TRANSVAGINAL ULTRASOUND OF PELVIS DOPPLER ULTRASOUND OF OVARIES TECHNIQUE: Both transabdominal and transvaginal ultrasound examinations of the pelvis were performed. Transabdominal technique was performed for global imaging of the pelvis including uterus, ovaries, adnexal regions, and pelvic cul-de-sac. It was necessary to proceed with endovaginal exam following the transabdominal exam to visualize the ovaries and IUD. Color and duplex Doppler ultrasound was utilized to evaluate blood flow to the  ovaries. COMPARISON:  Abdominal CT 06/27/2015 FINDINGS: Uterus Measurements: 7 x 3 x 5 cm. No fibroids or other mass visualized. Endometrium Thickness: 6 mm. IUD present and in unremarkable positioning. IUD shadowing obscures posterior endometrium at multiple levels. Right ovary Measurements: 33 x 15 x 18 mm. Normal appearance/no adnexal mass. Measured isoechoic structure is likely collapsing corpus luteum. Left ovary Measurements: 25 x 13 x 22 mm. Normal appearance/no adnexal mass. Pulsed Doppler evaluation of both ovaries demonstrates normal low-resistance arterial and venous waveforms. Other findings No abnormal free fluid. IMPRESSION: 1. Negative for ovarian torsion or other cause of pain. 2. IUD in good position. Electronically Signed   By: Marnee SpringJonathon  Watts M.D.   On: 08/10/2015 14:50   [place] Art/ven Flow Abd Pelv Doppler  08/10/2015  CLINICAL DATA:  Right lower quadrant abdominal pain EXAM: TRANSABDOMINAL AND TRANSVAGINAL ULTRASOUND OF PELVIS DOPPLER ULTRASOUND OF OVARIES TECHNIQUE: Both transabdominal and transvaginal ultrasound examinations of the pelvis were performed. Transabdominal technique was performed for global imaging of  the pelvis including uterus, ovaries, adnexal regions, and pelvic cul-de-sac. It was necessary to proceed with endovaginal exam following the transabdominal exam to visualize the ovaries and IUD. Color and duplex Doppler ultrasound was utilized to evaluate blood flow to the ovaries. COMPARISON:  Abdominal CT 06/27/2015 FINDINGS: Uterus Measurements: 7 x 3 x 5 cm. No fibroids or other mass visualized. Endometrium Thickness: 6 mm. IUD present and in unremarkable positioning. IUD shadowing obscures posterior endometrium at multiple levels. Right ovary Measurements: 33 x 15 x 18 mm. Normal appearance/no adnexal mass. Measured isoechoic structure is likely collapsing corpus luteum. Left ovary Measurements: 25 x 13 x 22 mm. Normal appearance/no adnexal mass. Pulsed Doppler evaluation of both ovaries demonstrates normal low-resistance arterial and venous waveforms. Other findings No abnormal free fluid. IMPRESSION: 1. Negative for ovarian torsion or other cause of pain. 2. IUD in good position. Electronically Signed   By: Marnee SpringJonathon  Watts M.D.   On: 08/10/2015 14:50   Ct Renal Stone Study  08/10/2015  CLINICAL DATA:  Chronic right lower quadrant abdominal pain. EXAM: CT ABDOMEN AND PELVIS WITHOUT CONTRAST TECHNIQUE: Multidetector CT imaging of the abdomen and pelvis was performed following the standard protocol without IV contrast. COMPARISON:  CT scan of Jun 27, 2015. FINDINGS: Visualized lung bases are unremarkable. No significant osseous abnormality is noted. No gallstones are noted. No focal abnormality is noted in the liver, spleen and pancreas on these unenhanced images. Adrenal glands appear normal. Stable severe right renal atrophy is noted. Left kidney and ureter appear normal. No hydronephrosis or renal obstruction is noted. No renal or ureteral calculi are noted. The appendix appears normal. There is no evidence of bowel obstruction. No abnormal fluid collection is noted. Intrauterine device is noted. Ovaries  appear normal. Urinary bladder appears normal. No significant adenopathy is noted. IMPRESSION: Stable severe right renal atrophy. Intrauterine device is noted. No acute abnormality is noted in the abdomen or pelvis. Electronically Signed   By: Lupita RaiderJames  Green Jr, M.D.   On: 08/10/2015 15:10   I have personally reviewed and evaluated these images and lab results as part of my medical decision-making.   EKG Interpretation None      MDM   Final diagnoses:  Right lower quadrant abdominal pain    Kristine Vaughn is a 35 year old female with a significant history of abdominal pain with no exact etiology found presenting with right-sided abdominal pain that she states is new for her. She notes that this is new because it is  has stayed in the right lower quadrant, normally it migrates around her abdomen. Her abdominal exam is non-surgical.  In February, she had an EGD that showed mild gastric inflammation and erosive reflux esophagitis and a colonoscopy that revealed external hemorrhoids. On my exam she was tender in the right lower quadrant and superpubic region. It did not seem as though she had a gynecologic workup thus far.  Urine pregnancy test was negative. Her lab work was unremarkable. I ordered both a transvaginal ultrasound to evaluate for ovarian torsion or cyst and a CT renal stone study to evaluate for nephrolithiasis and appendicitis, all of which was negative for any cause of her pain.  She was noted to have stable severe right renal atrophy which she was advised to follow-up on.  She received 1 dose of morphine 2 mg IV and Zofran IV, and later a repeat dose of morphine 4 mg IV.  As there is no emergent reason for her abdominal pain, the patient was stable for discharge home. She was able to tolerate by mouth intake. Discussed following up with both her gynecologist and GI doctor. The patient was agreement with the plan. Strict return precautions were discussed.   Joanna Puff, MD 08/10/15  1623  Blane Ohara, MD 08/12/15 [phone]

## 2015-08-10 NOTE — ED Notes (Signed)
Ice chips provided to patient at this time.

## 2015-08-10 NOTE — ED Notes (Signed)
Pt reports lower abd pain radiating to RLQ for last several days. Pt reports urinary difficulty and dysuria, vomiting since yesterday

## 2015-08-15 NOTE — Telephone Encounter (Signed)
Pt called wanting us to send a referral to the pain clinic that she found. I told her that we could not refer her but they could request them.

## 2015-08-28 ENCOUNTER — Emergency Department (HOSPITAL_COMMUNITY)
Admission: EM | Admit: 2015-08-28 | Discharge: 2015-08-28 | Disposition: A | Discharge status: Home / Self Care | Payer: Medicaid - Out of State | Attending: Emergency Medicine | Admitting: Emergency Medicine

## 2015-08-28 ENCOUNTER — Encounter (HOSPITAL_COMMUNITY): Payer: Self-pay | Admitting: Emergency Medicine

## 2015-08-28 DIAGNOSIS — F17210 Nicotine dependence, cigarettes, uncomplicated: Secondary | ICD-10-CM | POA: Insufficient documentation

## 2015-08-28 DIAGNOSIS — F319 Bipolar disorder, unspecified: Secondary | ICD-10-CM | POA: Insufficient documentation

## 2015-08-28 DIAGNOSIS — Z79891 Long term (current) use of opiate analgesic: Secondary | ICD-10-CM | POA: Insufficient documentation

## 2015-08-28 DIAGNOSIS — R110 Nausea: Secondary | ICD-10-CM | POA: Diagnosis not present

## 2015-08-28 DIAGNOSIS — R519 Headache, unspecified: Secondary | ICD-10-CM

## 2015-08-28 DIAGNOSIS — Z79899 Other long term (current) drug therapy: Secondary | ICD-10-CM | POA: Diagnosis not present

## 2015-08-28 DIAGNOSIS — R51 Headache: Secondary | ICD-10-CM | POA: Insufficient documentation

## 2015-08-28 LAB — CBC WITH DIFFERENTIAL/PLATELET
BASOS ABS: 0 10*3/uL (ref 0.0–0.1)
Basophils Relative: 0 %
EOS ABS: 0.1 10*3/uL (ref 0.0–0.7)
EOS PCT: 1 %
HCT: 34.2 % — ABNORMAL LOW (ref 36.0–46.0)
Hemoglobin: 11.1 g/dL — ABNORMAL LOW (ref 12.0–15.0)
LYMPHS ABS: 3.4 10*3/uL (ref 0.7–4.0)
LYMPHS PCT: 34 %
MCH: 28.8 pg (ref 26.0–34.0)
MCHC: 32.5 g/dL (ref 30.0–36.0)
MCV: 88.8 fL (ref 78.0–100.0)
MONO ABS: 0.5 10*3/uL (ref 0.1–1.0)
Monocytes Relative: 5 %
Neutro Abs: 5.9 10*3/uL (ref 1.7–7.7)
Neutrophils Relative %: 60 %
PLATELETS: 208 10*3/uL (ref 150–400)
RBC: 3.85 MIL/uL — AB (ref 3.87–5.11)
RDW: 15.2 % (ref 11.5–15.5)
WBC: 9.9 10*3/uL (ref 4.0–10.5)

## 2015-08-28 LAB — BASIC METABOLIC PANEL
Anion gap: 7 (ref 5–15)
BUN: 8 mg/dL (ref 6–20)
CALCIUM: 8.6 mg/dL — AB (ref 8.9–10.3)
CO2: 20 mmol/L — ABNORMAL LOW (ref 22–32)
CREATININE: 1.1 mg/dL — AB (ref 0.44–1.00)
Chloride: 113 mmol/L — ABNORMAL HIGH (ref 101–111)
GFR calc Af Amer: >60 mL/min (ref >60)
GLUCOSE: 84 mg/dL (ref 65–99)
Potassium: 3.6 mmol/L (ref 3.5–5.1)
SODIUM: 140 mmol/L (ref 135–145)

## 2015-08-28 LAB — I-STAT BETA HCG BLOOD, ED (MC, WL, AP ONLY)

## 2015-08-28 MED ORDER — METOCLOPRAMIDE HCL 5 MG/ML IJ SOLN
10.0000 mg | Freq: Once | INTRAMUSCULAR | Status: AC
  Administered 2015-08-28: 10 mg via INTRAVENOUS
  Filled 2015-08-28: qty 2

## 2015-08-28 MED ORDER — VALPROATE SODIUM 500 MG/5ML IV SOLN
INTRAVENOUS | Status: AC
  Filled 2015-08-28: qty 5

## 2015-08-28 MED ORDER — VALPROATE SODIUM 500 MG/5ML IV SOLN
500.0000 mg | Freq: Once | INTRAVENOUS | Status: AC
  Administered 2015-08-28: 500 mg via INTRAVENOUS
  Filled 2015-08-28: qty 5

## 2015-08-28 MED ORDER — ONDANSETRON HCL 4 MG/2ML IJ SOLN
4.0000 mg | Freq: Once | INTRAMUSCULAR | Status: AC
  Administered 2015-08-28: 4 mg via INTRAVENOUS
  Filled 2015-08-28: qty 2

## 2015-08-28 MED ORDER — DIPHENHYDRAMINE HCL 50 MG/ML IJ SOLN
25.0000 mg | Freq: Once | INTRAMUSCULAR | Status: AC
  Administered 2015-08-28: 25 mg via INTRAVENOUS
  Filled 2015-08-28: qty 1

## 2015-08-28 MED ORDER — KETOROLAC TROMETHAMINE 30 MG/ML IJ SOLN
30.0000 mg | Freq: Once | INTRAMUSCULAR | Status: AC
  Administered 2015-08-28: 30 mg via INTRAVENOUS
  Filled 2015-08-28: qty 1

## 2015-08-28 MED ORDER — SODIUM CHLORIDE 0.9 % IV BOLUS (SEPSIS)
1000.0000 mL | Freq: Once | INTRAVENOUS | Status: AC
  Administered 2015-08-28: 1000 mL via INTRAVENOUS

## 2015-08-28 MED ORDER — DEXAMETHASONE SODIUM PHOSPHATE 4 MG/ML IJ SOLN
10.0000 mg | Freq: Once | INTRAMUSCULAR | Status: AC
  Administered 2015-08-28: 10 mg via INTRAVENOUS
  Filled 2015-08-28: qty 3

## 2015-08-28 NOTE — Discharge Instructions (Signed)

## 2015-08-28 NOTE — ED Provider Notes (Signed)
CSN: [id number]     Arrival date & time 08/28/15  1655 History   First MD Initiated Contact with Patient 08/28/15 1710     Chief Complaint  Patient presents with  . Migraine     (Consider location/radiation/quality/duration/timing/severity/associated sxs/prior Treatment) HPI Comments: Patient reports six-day history of diffuse "migraine" headache that onset gradually after her menstrual cycle. She was seen in the ED 2 weeks ago for abdominal pain both here and at [facility]. He states that she started her cycle and the next day the headache started. Her abdominal pain is resolved. She has nausea but no vomiting. She is light and sound sensitive as usual per her migraines. She is out of her Maxalt at home. She denies any fevers, chills, vomiting. No focal weakness, numbness or tingling. No chest pain or shortness of breath. No urinary symptoms.  The history is provided by the patient and a relative.    Past Medical History  Diagnosis Date  . GERD (gastroesophageal reflux disease)   . PTSD (post-traumatic stress disorder)   . IBS (irritable bowel syndrome)   . Migraines   . Bipolar disorder (HCC)   . Chronic back pain    Past Surgical History  Procedure Laterality Date  . None to date  03/28/15  . Myringotomy with tube placement    . Cervical cone biopsy    . Colonoscopy with propofol N/A 04/25/2015    WUJ:WJXBJYNWRMR:external hemorrhoids otherwise norma; including normal distal TI  . Esophagogastroduodenoscopy (egd) with propofol N/A 04/25/2015    GNF:AOZHYQMRMR:erosive reflux esophagitis and  abnormal gastric mucosa s/p bx (reactive gastritis/chemical gastritis)  . Biopsy  04/25/2015    Procedure: BIOPSY;  Surgeon: Corbin Adeobert M Rourk, MD;  Location: [facility];  Service: Endoscopy;;  . Esophageal dilation  04/25/2015    Procedure: ESOPHAGEAL DILATION;  Surgeon: Corbin Adeobert M Rourk, MD;  Location: [facility];  Service: Endoscopy;;   Family History  Problem Relation Age of Onset  . Colon cancer Neg Hx   .  Celiac disease Neg Hx   . Inflammatory bowel disease Neg Hx    Social History  Substance Use Topics  . Smoking status: Current Every Day Smoker -- 0.50 packs/day for 17 years    Types: Cigarettes  . Smokeless tobacco: Never Used  . Alcohol Use: No   OB History    No data available     Review of Systems  Constitutional: Positive for activity change and appetite change. Negative for fever, chills and fatigue.  Eyes: Positive for photophobia.  Respiratory: Negative for cough, chest tightness and shortness of breath.   Cardiovascular: Negative for chest pain.  Gastrointestinal: Positive for nausea. Negative for vomiting, abdominal pain and diarrhea.  Genitourinary: Negative for dysuria, hematuria, vaginal bleeding and vaginal discharge.  Musculoskeletal: Negative for myalgias and arthralgias.  Skin: Negative for rash.  Neurological: Positive for headaches. Negative for dizziness, weakness, light-headedness and numbness.  A complete 10 system review of systems was obtained and all systems are negative except as noted in the HPI and PMH.      Allergies  Levsin and Meclizine  Home Medications   Prior to Admission medications   Medication Sig Start Date End Date Taking? Authorizing Provider  amitriptyline (ELAVIL) 10 MG tablet Take 10 mg by mouth at bedtime.   Yes Historical Provider, MD  buPROPion (WELLBUTRIN XL) 150 MG 24 hr tablet Take 450 mg by mouth daily.   Yes Historical Provider, MD  cetirizine (ZYRTEC) 10 MG tablet Take 10 mg  by mouth at bedtime.    Yes Historical Provider, MD  clonazePAM (KLONOPIN) 1 MG tablet Take 1 mg by mouth 3 (three) times daily.    Yes Historical Provider, MD  dicyclomine (BENTYL) 10 MG capsule Take 1 capsule (10 mg total) by mouth 4 (four) times daily -  before meals and at bedtime. 06/30/15  Yes Lynnea KocherLeslie S Lewis, PA-C  ferrous sulfate 325 (65 FE) MG tablet Take 325 mg by mouth daily with breakfast.   Yes Historical Provider, MD   HYDROcodone-acetaminophen (NORCO/VICODIN) 5-325 MG tablet Take 1 tablet by mouth daily as needed for moderate pain.  03/14/15  Yes Historical Provider, MD  levonorgestrel (MIRENA) 20 MCG/24HR IUD 1 each by Intrauterine route once.   Yes Historical Provider, MD  loperamide (IMODIUM) 2 MG capsule Take 2 mg by mouth as needed for diarrhea or loose stools.   Yes Historical Provider, MD  ondansetron (ZOFRAN-ODT) 8 MG disintegrating tablet Take 8 mg by mouth every 8 (eight) hours as needed for nausea or vomiting.   Yes Historical Provider, MD  pantoprazole (PROTONIX) 40 MG tablet Take 1 tablet (40 mg total) by mouth 2 (two) times daily before a meal. 06/23/15  Yes Chenise KocherLeslie S Lewis, PA-C  pramipexole (MIRAPEX) 0.5 MG tablet Take 0.5 mg by mouth at bedtime.   Yes Historical Provider, MD  propranolol (INDERAL) 60 MG tablet Take 60 mg by mouth 2 (two) times daily.   Yes Historical Provider, MD  risperiDONE (RISPERDAL) 3 MG tablet Take 6 mg by mouth at bedtime.   Yes Historical Provider, MD  rizatriptan (MAXALT) 10 MG tablet Take 10 mg by mouth as needed for migraine. May repeat in 2 hours if needed   Yes Historical Provider, MD  sucralfate (CARAFATE) 1 g tablet Take 1 g by mouth 4 (four) times daily -  with meals and at bedtime.   Yes Historical Provider, MD  topiramate (TOPAMAX) 100 MG tablet Take 100 mg by mouth 2 (two) times daily.   Yes Historical Provider, MD  traZODone (DESYREL) 100 MG tablet Take 200-300 mg by mouth at bedtime as needed for sleep.    Yes Historical Provider, MD  venlafaxine (EFFEXOR) 25 MG tablet Take 25 mg by mouth 2 (two) times daily.   Yes Historical Provider, MD   BP 125/96 mmHg  Pulse 73  Temp(Src) 97.8 F (36.6 C) (Oral)  Resp 16  Ht 5\' 6"  (1.676 m)  Wt 166 lb (75.297 kg)  BMI 26.81 kg/m2  SpO2 98%  LMP 08/22/2015 Physical Exam  Constitutional: She is oriented to person, place, and time. She appears well-developed and well-nourished. No distress.  HENT:  Head:  Normocephalic and atraumatic.  Mouth/Throat: Oropharynx is clear and moist. No oropharyngeal exudate.  Eyes: Conjunctivae and EOM are normal. Pupils are equal, round, and reactive to light.  photophobic  Neck: Normal range of motion. Neck supple.  No meningismus.  Cardiovascular: Normal rate, regular rhythm, normal heart sounds and intact distal pulses.   No murmur heard. Pulmonary/Chest: Effort normal and breath sounds normal. No respiratory distress. She exhibits no tenderness.  Abdominal: Soft. There is no tenderness. There is no rebound and no guarding.  Musculoskeletal: Normal range of motion. She exhibits no edema or tenderness.  Neurological: She is alert and oriented to person, place, and time. No cranial nerve deficit. She exhibits normal muscle tone. Coordination normal.  No ataxia on finger to nose bilaterally. No pronator drift. 5/5 strength throughout. CN 2-12 intact.Equal grip strength. Sensation intact.  Skin: Skin is warm.  Psychiatric: She has a normal mood and affect. Her behavior is normal.  Nursing note and vitals reviewed.   ED Course  Procedures (including critical care time) Labs Review Labs Reviewed  CBC WITH DIFFERENTIAL/PLATELET - Abnormal; Notable for the following:    RBC 3.85 (*)    Hemoglobin 11.1 (*)    HCT 34.2 (*)    All other components within normal limits  BASIC METABOLIC PANEL - Abnormal; Notable for the following:    Chloride 113 (*)    CO2 20 (*)    Creatinine, Ser 1.10 (*)    Calcium 8.6 (*)    All other components within normal limits  I-STAT BETA HCG BLOOD, ED ([facility], [facility], [facility] ONLY)    Imaging Review No results found. I have personally reviewed and evaluated these images and lab results as part of my medical decision-making.   EKG Interpretation None      MDM   Final diagnoses:  Headache, unspecified headache type  Gradual onset headache similar to previous migraines. No fever.  Neurologically intact.  Denies thunderclap onset.  Similar to previous migraines.  Low suspicion for meningitis, temporal arteritis, SAH.  IVF, zofran , toradol, reglan, benadryl.  Improving but not resolved. Decadron and depacon given.  Reports headache improved with treatment.  Tolerating PO and ambulatory. Requesting to go home.  FOllow up with her neurologist and PCP. Return precautions discussed.   Glynn Octave, MD 08/28/15 [phone]

## 2015-08-28 NOTE — ED Notes (Signed)
Pt reports persistent migraine with light sensitivity and nausea since Monday. Pt hx of migraines.

## 2015-09-10 ENCOUNTER — Emergency Department (HOSPITAL_COMMUNITY)
Admission: EM | Admit: 2015-09-10 | Discharge: 2015-09-10 | Disposition: A | Discharge status: Home / Self Care | Payer: Medicaid - Out of State | Attending: Emergency Medicine | Admitting: Emergency Medicine

## 2015-09-10 ENCOUNTER — Encounter (HOSPITAL_COMMUNITY): Payer: Self-pay | Admitting: Emergency Medicine

## 2015-09-10 ENCOUNTER — Emergency Department (HOSPITAL_COMMUNITY): Payer: Medicaid - Out of State

## 2015-09-10 DIAGNOSIS — G43909 Migraine, unspecified, not intractable, without status migrainosus: Secondary | ICD-10-CM | POA: Insufficient documentation

## 2015-09-10 DIAGNOSIS — Z79899 Other long term (current) drug therapy: Secondary | ICD-10-CM | POA: Diagnosis not present

## 2015-09-10 DIAGNOSIS — F319 Bipolar disorder, unspecified: Secondary | ICD-10-CM | POA: Diagnosis not present

## 2015-09-10 DIAGNOSIS — F17210 Nicotine dependence, cigarettes, uncomplicated: Secondary | ICD-10-CM | POA: Insufficient documentation

## 2015-09-10 LAB — URINALYSIS, ROUTINE W REFLEX MICROSCOPIC
Bilirubin Urine: NEGATIVE
GLUCOSE, UA: NEGATIVE mg/dL
HGB URINE DIPSTICK: NEGATIVE
Ketones, ur: NEGATIVE mg/dL
Leukocytes, UA: NEGATIVE
Nitrite: NEGATIVE
Protein, ur: NEGATIVE mg/dL
Specific Gravity, Urine: <1.005 — ABNORMAL LOW (ref 1.005–1.030)
pH: 6 (ref 5.0–8.0)

## 2015-09-10 LAB — PREGNANCY, URINE: PREG TEST UR: NEGATIVE

## 2015-09-10 MED ORDER — ONDANSETRON HCL 4 MG/2ML IJ SOLN
4.0000 mg | Freq: Once | INTRAMUSCULAR | Status: AC
  Administered 2015-09-10: 4 mg via INTRAVENOUS
  Filled 2015-09-10: qty 2

## 2015-09-10 MED ORDER — DIPHENHYDRAMINE HCL 50 MG/ML IJ SOLN
25.0000 mg | Freq: Once | INTRAMUSCULAR | Status: AC
  Administered 2015-09-10: 25 mg via INTRAVENOUS
  Filled 2015-09-10: qty 1

## 2015-09-10 MED ORDER — SODIUM CHLORIDE 0.9 % IV BOLUS (SEPSIS)
1000.0000 mL | Freq: Once | INTRAVENOUS | Status: AC
Start: 2015-09-10 — End: 2015-09-10
  Administered 2015-09-10: 1000 mL via INTRAVENOUS

## 2015-09-10 MED ORDER — MORPHINE SULFATE (PF) 4 MG/ML IV SOLN
4.0000 mg | Freq: Once | INTRAVENOUS | Status: AC
Start: 2015-09-10 — End: 2015-09-10
  Administered 2015-09-10: 4 mg via INTRAVENOUS
  Filled 2015-09-10: qty 1

## 2015-09-10 MED ORDER — KETOROLAC TROMETHAMINE 30 MG/ML IJ SOLN
30.0000 mg | Freq: Once | INTRAMUSCULAR | Status: AC
  Administered 2015-09-10: 30 mg via INTRAVENOUS
  Filled 2015-09-10: qty 1

## 2015-09-10 NOTE — ED Notes (Signed)
Patient c/o migraine. Per patient started today at 2412. Patient reports nausea, sensitivity to light, sound, and smell. Per patient took 2 Maxalt today with no relief. Per patient slight dizziness.

## 2015-09-10 NOTE — ED Provider Notes (Signed)
CSN: [id number]     Arrival date & time 09/10/15  1818 History   First MD Initiated Contact with Patient 09/10/15 1830     Chief Complaint  Patient presents with  . Migraine  Pt is a 35 yo wf with a hx of migraines.  She presents today with her typical migraine.  It started around noon.  The pt was seen in the ED on 08/28/15 for the same.  The pt said she's had a cough and has had some dizziness.   (Consider location/radiation/quality/duration/timing/severity/associated sxs/prior Treatment) Patient is a 35 y.o. female presenting with migraines. The history is provided by the patient.  Migraine This is a recurrent problem. The current episode started 6 to 12 hours ago. The problem occurs constantly. The problem has been gradually worsening. Associated symptoms include headaches. Nothing relieves the symptoms.    Past Medical History  Diagnosis Date  . GERD (gastroesophageal reflux disease)   . PTSD (post-traumatic stress disorder)   . IBS (irritable bowel syndrome)   . Migraines   . Bipolar disorder (HCC)   . Chronic back pain    Past Surgical History  Procedure Laterality Date  . None to date  03/28/15  . Myringotomy with tube placement    . Cervical cone biopsy    . Colonoscopy with propofol N/A 04/25/2015    WUJ:WJXBJYNW hemorrhoids otherwise norma; including normal distal TI  . Esophagogastroduodenoscopy (egd) with propofol N/A 04/25/2015    GNF:AOZHYQM reflux esophagitis and  abnormal gastric mucosa s/p bx (reactive gastritis/chemical gastritis)  . Biopsy  04/25/2015    Procedure: BIOPSY;  Surgeon: Corbin Ade, MD;  Location: [facility];  Service: Endoscopy;;  . Esophageal dilation  04/25/2015    Procedure: ESOPHAGEAL DILATION;  Surgeon: Corbin Ade, MD;  Location: [facility];  Service: Endoscopy;;   Family History  Problem Relation Age of Onset  . Colon cancer Neg Hx   . Celiac disease Neg Hx   . Inflammatory bowel disease Neg Hx    Social History  Substance Use  Topics  . Smoking status: Current Every Day Smoker -- 0.50 packs/day for 17 years    Types: Cigarettes  . Smokeless tobacco: Never Used  . Alcohol Use: No   OB History    No data available     Review of Systems  Respiratory: Positive for cough.   Gastrointestinal: Positive for nausea.  Neurological: Positive for headaches.  All other systems reviewed and are negative.     Allergies  Levsin and Meclizine  Home Medications   Prior to Admission medications   Medication Sig Start Date End Date Taking? Authorizing Provider  amitriptyline (ELAVIL) 10 MG tablet Take 10 mg by mouth at bedtime.   Yes Historical Provider, MD  buPROPion (WELLBUTRIN XL) 150 MG 24 hr tablet Take 450 mg by mouth daily.   Yes Historical Provider, MD  CALCIUM PO Take 1 tablet by mouth daily.   Yes Historical Provider, MD  clonazePAM (KLONOPIN) 1 MG tablet Take 1 mg by mouth 3 (three) times daily.    Yes Historical Provider, MD  Cyanocobalamin (VITAMIN B-12 PO) Take 1 tablet by mouth daily.   Yes Historical Provider, MD  ferrous sulfate 325 (65 FE) MG tablet Take 325 mg by mouth daily with breakfast.   Yes Historical Provider, MD  HYDROcodone-acetaminophen (NORCO/VICODIN) 5-325 MG tablet Take 1 tablet by mouth daily as needed for moderate pain.  03/14/15  Yes Historical Provider, MD  ondansetron (ZOFRAN-ODT) 8 MG disintegrating  tablet Take 8 mg by mouth every 8 (eight) hours as needed for nausea or vomiting.   Yes Historical Provider, MD  pantoprazole (PROTONIX) 40 MG tablet Take 1 tablet (40 mg total) by mouth 2 (two) times daily before a meal. 06/23/15  Yes Shemeka KocherLeslie S Lewis, PA-C  pramipexole (MIRAPEX) 0.5 MG tablet Take 0.5 mg by mouth at bedtime.   Yes Historical Provider, MD  propranolol (INDERAL) 60 MG tablet Take 60 mg by mouth 2 (two) times daily.   Yes Historical Provider, MD  risperiDONE (RISPERDAL) 3 MG tablet Take 6 mg by mouth at bedtime.   Yes Historical Provider, MD  rizatriptan (MAXALT) 10 MG tablet  Take 10 mg by mouth as needed for migraine. May repeat in 2 hours if needed   Yes Historical Provider, MD  sucralfate (CARAFATE) 1 g tablet Take 1 g by mouth 4 (four) times daily -  with meals and at bedtime.   Yes Historical Provider, MD  topiramate (TOPAMAX) 100 MG tablet Take 100 mg by mouth 2 (two) times daily.   Yes Historical Provider, MD  traZODone (DESYREL) 100 MG tablet Take 200-300 mg by mouth at bedtime as needed for sleep.    Yes Historical Provider, MD  venlafaxine (EFFEXOR) 25 MG tablet Take 25 mg by mouth 2 (two) times daily.   Yes Historical Provider, MD  dicyclomine (BENTYL) 10 MG capsule Take 1 capsule (10 mg total) by mouth 4 (four) times daily -  before meals and at bedtime. Patient not taking: Reported on 09/10/2015 06/30/15   Alzora KocherLeslie S Lewis, PA-C  MICROGESTIN 1.5-30 MG-MCG tablet Take 1 tablet by mouth daily.  09/02/15   Historical Provider, MD   BP 100/61 mmHg  Pulse 84  Temp(Src) 98.1 F (36.7 C) (Temporal)  Resp 18  Ht 5\' 6"  (1.676 m)  Wt 159 lb (72.122 kg)  BMI 25.68 kg/m2  SpO2 99%  LMP 08/22/2015 Physical Exam  Constitutional: She is oriented to person, place, and time. She appears well-developed and well-nourished.  HENT:  Head: Normocephalic and atraumatic.  Right Ear: External ear normal.  Left Ear: External ear normal.  Nose: Nose normal.  Mouth/Throat: Oropharynx is clear and moist.  Eyes: Conjunctivae and EOM are normal. Pupils are equal, round, and reactive to light.  Neck: Normal range of motion. Neck supple.  Cardiovascular: Normal rate, regular rhythm, normal heart sounds and intact distal pulses.   Pulmonary/Chest: Effort normal and breath sounds normal.  Abdominal: Soft. Bowel sounds are normal.  Musculoskeletal: Normal range of motion.  Neurological: She is alert and oriented to person, place, and time.  Skin: Skin is warm and dry.  Psychiatric: She has a normal mood and affect. Her behavior is normal. Judgment and thought content normal.   Nursing note and vitals reviewed.   ED Course  Procedures (including critical care time) Labs Review Labs Reviewed  URINALYSIS, ROUTINE W REFLEX MICROSCOPIC (NOT AT [facility]) - Abnormal; Notable for the following:    Specific Gravity, Urine <1.005 (*)    All other components within normal limits  PREGNANCY, URINE    Imaging Review Dg Chest 2 View  09/10/2015  CLINICAL DATA:  35 year old female with cough and dizziness EXAM: CHEST  2 VIEW COMPARISON:  None. FINDINGS: The heart size and mediastinal contours are within normal limits. Both lungs are clear. The visualized skeletal structures are unremarkable. IMPRESSION: No active cardiopulmonary disease. Electronically Signed   By: Elgie CollardArash  Radparvar M.D.   On: 09/10/2015 19:02   I have personally  reviewed and evaluated these images and lab results as part of my medical decision-making.   EKG Interpretation None      MDM  H/a only down to a 7 after ivfs, zofran, toradol, and benadryl.  She was then given 4 mg of morphine and her pain is gone.  She feels ok for d/c.  She is on multiple meds at home and is encouraged to take her home meds.  She knows to return if worse and to f/u with pcp. Final diagnoses:  Migraine without status migrainosus, not intractable, unspecified migraine type        Jacalyn Lefevre, MD 09/10/15 2036

## 2015-09-10 NOTE — Discharge Instructions (Signed)
Migraine Headache  A migraine headache is very bad, throbbing pain on one or both sides of your head. Talk to your doctor about what things may bring on (trigger) your migraine headaches.  HOME CARE  · Only take medicines as told by your doctor.  · Lie down in a dark, quiet room when you have a migraine.  · Keep a journal to find out if certain things bring on migraine headaches. For example, write down:    What you eat and drink.    How much sleep you get.    Any change to your diet or medicines.  · Lessen how much alcohol you drink.  · Quit smoking if you smoke.  · Get enough sleep.  · Lessen any stress in your life.  · Keep lights dim if bright lights bother you or make your migraines worse.  GET HELP RIGHT AWAY IF:   · Your migraine becomes really bad.  · You have a fever.  · You have a stiff neck.  · You have trouble seeing.  · Your muscles are weak, or you lose muscle control.  · You lose your balance or have trouble walking.  · You feel like you will pass out (faint), or you pass out.  · You have really bad symptoms that are different than your first symptoms.  MAKE SURE YOU:   · Understand these instructions.  · Will watch your condition.  · Will get help right away if you are not doing well or get worse.     This information is not intended to replace advice given to you by your health care provider. Make sure you discuss any questions you have with your health care provider.     Document Released: 11/22/2007 Document Revised: 05/07/2011 Document Reviewed: 10/20/2012  Elsevier Interactive Patient Education ©2016 [employer]

## 2015-11-12 ENCOUNTER — Encounter (HOSPITAL_COMMUNITY): Payer: Self-pay | Admitting: Emergency Medicine

## 2015-11-12 ENCOUNTER — Emergency Department (HOSPITAL_COMMUNITY)
Admission: EM | Admit: 2015-11-12 | Discharge: 2015-11-13 | Disposition: A | Discharge status: Home / Self Care | Payer: Medicaid - Out of State | Attending: Emergency Medicine | Admitting: Emergency Medicine

## 2015-11-12 DIAGNOSIS — Z79899 Other long term (current) drug therapy: Secondary | ICD-10-CM | POA: Insufficient documentation

## 2015-11-12 DIAGNOSIS — F17210 Nicotine dependence, cigarettes, uncomplicated: Secondary | ICD-10-CM | POA: Diagnosis not present

## 2015-11-12 DIAGNOSIS — G8929 Other chronic pain: Secondary | ICD-10-CM | POA: Diagnosis not present

## 2015-11-12 DIAGNOSIS — M545 Low back pain, unspecified: Secondary | ICD-10-CM

## 2015-11-12 DIAGNOSIS — E876 Hypokalemia: Secondary | ICD-10-CM | POA: Diagnosis not present

## 2015-11-12 LAB — CBC WITH DIFFERENTIAL/PLATELET
BASOS ABS: 0 10*3/uL (ref 0.0–0.1)
Basophils Relative: 0 %
EOS ABS: 0.2 10*3/uL (ref 0.0–0.7)
EOS PCT: 3 %
HCT: 35.4 % — ABNORMAL LOW (ref 36.0–46.0)
HEMOGLOBIN: 11.8 g/dL — AB (ref 12.0–15.0)
LYMPHS ABS: 2.2 10*3/uL (ref 0.7–4.0)
Lymphocytes Relative: 27 %
MCH: 29.6 pg (ref 26.0–34.0)
MCHC: 33.3 g/dL (ref 30.0–36.0)
MCV: 88.9 fL (ref 78.0–100.0)
Monocytes Absolute: 0.7 10*3/uL (ref 0.1–1.0)
Monocytes Relative: 9 %
NEUTROS PCT: 61 %
Neutro Abs: 5.1 10*3/uL (ref 1.7–7.7)
PLATELETS: 186 10*3/uL (ref 150–400)
RBC: 3.98 MIL/uL (ref 3.87–5.11)
RDW: 13.3 % (ref 11.5–15.5)
WBC: 8.3 10*3/uL (ref 4.0–10.5)

## 2015-11-12 LAB — BASIC METABOLIC PANEL
Anion gap: 7 (ref 5–15)
BUN: 5 mg/dL — ABNORMAL LOW (ref 6–20)
CHLORIDE: 111 mmol/L (ref 101–111)
CO2: 20 mmol/L — ABNORMAL LOW (ref 22–32)
CREATININE: 1.03 mg/dL — AB (ref 0.44–1.00)
Calcium: 9.2 mg/dL (ref 8.9–10.3)
Glucose, Bld: 100 mg/dL — ABNORMAL HIGH (ref 65–99)
POTASSIUM: 3.2 mmol/L — AB (ref 3.5–5.1)
SODIUM: 138 mmol/L (ref 135–145)

## 2015-11-12 MED ORDER — KETOROLAC TROMETHAMINE 60 MG/2ML IM SOLN
60.0000 mg | Freq: Once | INTRAMUSCULAR | Status: AC
  Administered 2015-11-12: 60 mg via INTRAMUSCULAR
  Filled 2015-11-12: qty 2

## 2015-11-12 MED ORDER — METHOCARBAMOL 500 MG PO TABS
500.0000 mg | ORAL_TABLET | Freq: Once | ORAL | Status: AC
  Administered 2015-11-12: 500 mg via ORAL
  Filled 2015-11-12: qty 1

## 2015-11-12 NOTE — ED Notes (Signed)
Patient ambulatory to restroom to collect urine sample 

## 2015-11-12 NOTE — ED Provider Notes (Signed)
[facility] Provider Note   CSN: [id number] Arrival date & time: 11/12/15  2048     History   Chief Complaint Chief Complaint  Patient presents with  . Back Pain  . Fatigue    HPI Kristine Vaughn is a 35 y.o. female.  HPI   Kristine Vaughn is a 35 y.o. female with hx of chronic low back pain who presents to the Emergency Department complaining of low back pain for 2-3 days.  She states that she has been moving to a new home and lifting boxes and furniture this week and felt a sharp pain to her back when she bent over to pick up a box.  Describes pain to the center of her lower back that radiates to the sides, and associated with movement.  She also reports generalized fatigue and weakness for two days.  She reports having a "bad" menstrual cycle this month associated with heavy vaginal bleeding since yesterday.  She states that she had a recent tubal ligation and this is her first menses since the procedure.  She denies abdominal pain, fever, urine or bowel changes, pain, numbness or weakness of the LE's.     Past Medical History:  Diagnosis Date  . Bipolar disorder (HCC)   . Chronic back pain   . GERD (gastroesophageal reflux disease)   . IBS (irritable bowel syndrome)   . Migraines   . PTSD (post-traumatic stress disorder)     Patient Active Problem List   Diagnosis Date Noted  . Melena 06/23/2015  . Lower abdominal pain 06/23/2015  . Diarrhea 06/23/2015  . Hematemesis 06/23/2015  . Other hemorrhoids   . Hematochezia   . Mucosal abnormality of stomach   . Reflux esophagitis   . GERD (gastroesophageal reflux disease) 03/28/2015  . Dysphagia 03/28/2015  . Rectal bleeding 03/28/2015  . Constipation 03/28/2015    Past Surgical History:  Procedure Laterality Date  . BIOPSY  04/25/2015   Procedure: BIOPSY;  Surgeon: Corbin Ade, MD;  Location: [facility];  Service: Endoscopy;;  . CERVICAL CONE BIOPSY    . COLONOSCOPY WITH PROPOFOL N/A 04/25/2015   [id number] hemorrhoids otherwise norma; including normal distal TI  . ESOPHAGEAL DILATION  04/25/2015   Procedure: ESOPHAGEAL DILATION;  Surgeon: Corbin Ade, MD;  Location: [facility];  Service: Endoscopy;;  . ESOPHAGOGASTRODUODENOSCOPY (EGD) WITH PROPOFOL N/A 04/25/2015   [id number] reflux esophagitis and  abnormal gastric mucosa s/p bx (reactive gastritis/chemical gastritis)  . MYRINGOTOMY WITH TUBE PLACEMENT    . None to Date  03/28/15    OB History    No data available       Home Medications    Prior to Admission medications   Medication Sig Start Date End Date Taking? Authorizing Provider  traZODone (DESYREL) 100 MG tablet Take 200-300 mg by mouth at bedtime as needed for sleep.    Yes Historical Provider, MD  amitriptyline (ELAVIL) 10 MG tablet Take 10 mg by mouth at bedtime.    Historical Provider, MD  buPROPion (WELLBUTRIN XL) 150 MG 24 hr tablet Take 450 mg by mouth daily.    Historical Provider, MD  CALCIUM PO Take 1 tablet by mouth daily.    Historical Provider, MD  clonazePAM (KLONOPIN) 1 MG tablet Take 1 mg by mouth 3 (three) times daily.     Historical Provider, MD  Cyanocobalamin (VITAMIN B-12 PO) Take 1 tablet by mouth daily.    Historical Provider, MD  dicyclomine (BENTYL) 10 MG capsule Take 1  capsule (10 mg total) by mouth 4 (four) times daily -  before meals and at bedtime. Patient not taking: Reported on 09/10/2015 06/30/15   Denai Kocher, PA-C  ferrous sulfate 325 (65 FE) MG tablet Take 325 mg by mouth daily with breakfast.    Historical Provider, MD  HYDROcodone-acetaminophen (NORCO/VICODIN) 5-325 MG tablet Take 1 tablet by mouth daily as needed for moderate pain.  03/14/15   Historical Provider, MD  MICROGESTIN 1.5-30 MG-MCG tablet Take 1 tablet by mouth daily.  09/02/15   Historical Provider, MD  ondansetron (ZOFRAN-ODT) 8 MG disintegrating tablet Take 8 mg by mouth every 8 (eight) hours as needed for nausea or vomiting.    Historical Provider, MD    pantoprazole (PROTONIX) 40 MG tablet Take 1 tablet (40 mg total) by mouth 2 (two) times daily before a meal. 06/23/15   Mannat Kocher, PA-C  pramipexole (MIRAPEX) 0.5 MG tablet Take 0.5 mg by mouth at bedtime.    Historical Provider, MD  propranolol (INDERAL) 60 MG tablet Take 60 mg by mouth 2 (two) times daily.    Historical Provider, MD  risperiDONE (RISPERDAL) 3 MG tablet Take 6 mg by mouth at bedtime.    Historical Provider, MD  rizatriptan (MAXALT) 10 MG tablet Take 10 mg by mouth as needed for migraine. May repeat in 2 hours if needed    Historical Provider, MD  sucralfate (CARAFATE) 1 g tablet Take 1 g by mouth 4 (four) times daily -  with meals and at bedtime.    Historical Provider, MD  topiramate (TOPAMAX) 100 MG tablet Take 100 mg by mouth 2 (two) times daily.    Historical Provider, MD  venlafaxine (EFFEXOR) 25 MG tablet Take 25 mg by mouth 2 (two) times daily.    Historical Provider, MD    Family History Family History  Problem Relation Age of Onset  . Colon cancer Neg Hx   . Celiac disease Neg Hx   . Inflammatory bowel disease Neg Hx     Social History Social History  Substance Use Topics  . Smoking status: Current Every Day Smoker    Packs/day: 0.50    Years: 17.00    Types: Cigarettes  . Smokeless tobacco: Never Used  . Alcohol use No     Allergies   Levsin [hyoscyamine]; Meclizine; and Microgestin 24 fe [norethin ace-eth estrad-fe]   Review of Systems Review of Systems  Constitutional: Negative for fever.  Respiratory: Negative for shortness of breath.   Gastrointestinal: Negative for abdominal pain, constipation and vomiting.  Genitourinary: Negative for decreased urine volume, difficulty urinating, dysuria, flank pain and hematuria.  Musculoskeletal: Positive for back pain. Negative for joint swelling.  Skin: Negative for rash.  Neurological: Negative for dizziness, weakness and numbness.  All other systems reviewed and are negative.    Physical  Exam Updated Vital Signs BP 105/72 (BP Location: Left Arm)   Pulse 88   Temp 97.7 F (36.5 C) (Oral)   Resp 18   Ht 5\' 6"  (1.676 m)   Wt 70.8 kg   LMP 11/11/2015 Comment: had tubes tied  SpO2 99%   BMI 25.18 kg/m   Physical Exam  Constitutional: She is oriented to person, place, and time. She appears well-developed and well-nourished. No distress.  HENT:  Head: Normocephalic and atraumatic.  Neck: Normal range of motion. Neck supple.  Cardiovascular: Normal rate, regular rhythm and intact distal pulses.   No murmur heard. Pulmonary/Chest: Effort normal and breath sounds normal. No respiratory  distress.  Abdominal: Soft. She exhibits no distension. There is no tenderness.  Musculoskeletal: She exhibits tenderness. She exhibits no edema.       Lumbar back: She exhibits tenderness and pain. She exhibits normal range of motion, no swelling, no deformity, no laceration and normal pulse.  ttp of the lower lumbar spine and bilateral paraspinal muscles.  DP pulses are brisk and symmetrical.  Distal sensation intact.  Pt has 5/5 strength against resistance of bilateral lower extremities.     Neurological: She is alert and oriented to person, place, and time. She has normal strength. No sensory deficit. She exhibits normal muscle tone. Coordination and gait normal.  Reflex Scores:      Patellar reflexes are 2+ on the right side and 2+ on the left side.      Achilles reflexes are 2+ on the right side and 2+ on the left side. Skin: Skin is warm and dry. No rash noted.  Nursing note and vitals reviewed.    ED Treatments / Results  Labs (all labs ordered are listed, but only abnormal results are displayed) Labs Reviewed  URINALYSIS, ROUTINE W REFLEX MICROSCOPIC (NOT AT [facility]) - Abnormal; Notable for the following:       Result Value   APPearance HAZY (*)    Specific Gravity, Urine >1.030 (*)    Hgb urine dipstick LARGE (*)    Protein, ur TRACE (*)    All other components within normal  limits  BASIC METABOLIC PANEL - Abnormal; Notable for the following:    Potassium 3.2 (*)    CO2 20 (*)    Glucose, Bld 100 (*)    BUN 5 (*)    Creatinine, Ser 1.03 (*)    All other components within normal limits  CBC WITH DIFFERENTIAL/PLATELET - Abnormal; Notable for the following:    Hemoglobin 11.8 (*)    HCT 35.4 (*)    All other components within normal limits  URINE MICROSCOPIC-ADD ON - Abnormal; Notable for the following:    Squamous Epithelial / LPF 6-30 (*)    Bacteria, UA FEW (*)    All other components within normal limits  PREGNANCY, URINE    EKG  EKG Interpretation None       Radiology No results found.  Procedures Procedures (including critical care time)  Medications Ordered in ED Medications  ketorolac (TORADOL) injection 60 mg (60 mg Intramuscular Given 11/12/15 2334)  methocarbamol (ROBAXIN) tablet 500 mg (500 mg Oral Given 11/12/15 2334)     Initial Impression / Assessment and Plan / ED Course  I have reviewed the triage vital signs and the nursing notes.  Pertinent labs & imaging results that were available during my care of the patient were reviewed by me and considered in my medical decision making (see chart for details).  Clinical Course    Pt with hx of chronic low back pain.  No focal neuro deficits, no concerning sx's for emergent neurological or infectious process.  Ambulatory in the dept with steady gait.   Mild hypokalemia, addressed here with po potassium.  Back pain improved after medications, pt resting comfortably.  Pt appears stable for d/c.  Agrees to PMD f/u.  She is waiting for referral from her PMD to pain management.  Return precautions given.  Final Clinical Impressions(s) / ED Diagnoses   Final diagnoses:  Bilateral low back pain without sciatica  Hypokalemia    New Prescriptions New Prescriptions   No medications on file  Pauline Ausammy Deasia Chiu, PA-C 11/13/15 0105    Eber HongBrian Miller, MD 11/13/15 [phone]

## 2015-11-12 NOTE — ED Triage Notes (Signed)
Pt with hx of back pain. Was moving today and thinks she lifted too much and now back is bothering her. Pt also states she started her period yesterday and that she is bleeding heavily (filling up a super tampon every 3 hrs) and that she is feeling extremely weak and "can't hardly stay awake".

## 2015-11-13 LAB — PREGNANCY, URINE: PREG TEST UR: NEGATIVE

## 2015-11-13 LAB — URINE MICROSCOPIC-ADD ON

## 2015-11-13 LAB — URINALYSIS, ROUTINE W REFLEX MICROSCOPIC
Bilirubin Urine: NEGATIVE
Glucose, UA: NEGATIVE mg/dL
Ketones, ur: NEGATIVE mg/dL
LEUKOCYTES UA: NEGATIVE
Nitrite: NEGATIVE
pH: 5.5 (ref 5.0–8.0)

## 2015-11-13 MED ORDER — POTASSIUM CHLORIDE CRYS ER 20 MEQ PO TBCR
40.0000 meq | EXTENDED_RELEASE_TABLET | Freq: Once | ORAL | Status: AC
  Administered 2015-11-13: 40 meq via ORAL
  Filled 2015-11-13: qty 2

## 2015-11-13 MED ORDER — NAPROXEN 500 MG PO TABS: 500.0000 mg | ORAL_TABLET | Freq: Two times a day (BID) | ORAL | 0 refills | Status: DC

## 2015-11-13 MED ORDER — METHOCARBAMOL 500 MG PO TABS: 500.0000 mg | ORAL_TABLET | Freq: Three times a day (TID) | ORAL | 0 refills | Status: DC

## 2015-11-13 NOTE — ED Notes (Signed)
Patient and mother verbalize understanding of discharge instructions, home care and follow up care. Patient out of department at this time 

## 2015-11-13 NOTE — Discharge Instructions (Signed)
Alternate ice and heat to your back.  Avoid bending or heavy lifting for one week.  Try to eat potassium rich foods, a list is provided.  Follow-up with your doctor for recheck.  Return to ER for any worsening symptoms

## 2015-11-13 NOTE — ED Notes (Signed)
Patient verbalizes understanding of discharge instructions, prescriptions, home care and follow up care. Patient ambulatory out of department at this time with family. 

## 2015-11-23 ENCOUNTER — Emergency Department (HOSPITAL_COMMUNITY)
Admission: EM | Admit: 2015-11-23 | Discharge: 2015-11-24 | Disposition: A | Discharge status: Home / Self Care | Payer: Medicaid - Out of State | Attending: Emergency Medicine | Admitting: Emergency Medicine

## 2015-11-23 ENCOUNTER — Encounter (HOSPITAL_COMMUNITY): Payer: Self-pay

## 2015-11-23 DIAGNOSIS — Z79899 Other long term (current) drug therapy: Secondary | ICD-10-CM | POA: Diagnosis not present

## 2015-11-23 DIAGNOSIS — F17210 Nicotine dependence, cigarettes, uncomplicated: Secondary | ICD-10-CM | POA: Diagnosis not present

## 2015-11-23 DIAGNOSIS — G43809 Other migraine, not intractable, without status migrainosus: Secondary | ICD-10-CM | POA: Insufficient documentation

## 2015-11-23 DIAGNOSIS — R197 Diarrhea, unspecified: Secondary | ICD-10-CM | POA: Diagnosis not present

## 2015-11-23 DIAGNOSIS — F19230 Other psychoactive substance dependence with withdrawal, uncomplicated: Secondary | ICD-10-CM | POA: Diagnosis not present

## 2015-11-23 DIAGNOSIS — R51 Headache: Secondary | ICD-10-CM | POA: Diagnosis present

## 2015-11-23 DIAGNOSIS — F19939 Other psychoactive substance use, unspecified with withdrawal, unspecified: Secondary | ICD-10-CM

## 2015-11-23 LAB — COMPREHENSIVE METABOLIC PANEL
ALT: 12 U/L — ABNORMAL LOW (ref 14–54)
ANION GAP: 5 (ref 5–15)
AST: 14 U/L — ABNORMAL LOW (ref 15–41)
Albumin: 3 g/dL — ABNORMAL LOW (ref 3.5–5.0)
Alkaline Phosphatase: 81 U/L (ref 38–126)
BILIRUBIN TOTAL: 0.3 mg/dL (ref 0.3–1.2)
BUN: 5 mg/dL — AB (ref 6–20)
CALCIUM: 8.8 mg/dL — AB (ref 8.9–10.3)
CO2: 22 mmol/L (ref 22–32)
Chloride: 110 mmol/L (ref 101–111)
Creatinine, Ser: 0.79 mg/dL (ref 0.44–1.00)
GLUCOSE: 89 mg/dL (ref 65–99)
POTASSIUM: 3.5 mmol/L (ref 3.5–5.1)
Sodium: 137 mmol/L (ref 135–145)
TOTAL PROTEIN: 5.7 g/dL — AB (ref 6.5–8.1)

## 2015-11-23 LAB — CBC
HCT: 36 % (ref 36.0–46.0)
HEMOGLOBIN: 11.8 g/dL — AB (ref 12.0–15.0)
MCH: 30 pg (ref 26.0–34.0)
MCHC: 32.8 g/dL (ref 30.0–36.0)
MCV: 91.6 fL (ref 78.0–100.0)
Platelets: 180 10*3/uL (ref 150–400)
RBC: 3.93 MIL/uL (ref 3.87–5.11)
RDW: 14.4 % (ref 11.5–15.5)
WBC: 9.1 10*3/uL (ref 4.0–10.5)

## 2015-11-23 LAB — RAPID URINE DRUG SCREEN, HOSP PERFORMED
Amphetamines: NOT DETECTED
BARBITURATES: NOT DETECTED
Benzodiazepines: NOT DETECTED
COCAINE: NOT DETECTED
OPIATES: NOT DETECTED
TETRAHYDROCANNABINOL: POSITIVE — AB

## 2015-11-23 LAB — ETHANOL

## 2015-11-23 LAB — LIPASE, BLOOD: Lipase: 17 U/L (ref 11–51)

## 2015-11-23 LAB — ACETAMINOPHEN LEVEL: Acetaminophen (Tylenol), Serum: <10 ug/mL — ABNORMAL LOW (ref 10–30)

## 2015-11-23 LAB — SALICYLATE LEVEL

## 2015-11-23 MED ORDER — DEXAMETHASONE SODIUM PHOSPHATE 4 MG/ML IJ SOLN
10.0000 mg | Freq: Once | INTRAMUSCULAR | Status: AC
  Administered 2015-11-23: 10 mg via INTRAVENOUS
  Filled 2015-11-23: qty 3

## 2015-11-23 MED ORDER — KETOROLAC TROMETHAMINE 30 MG/ML IJ SOLN
30.0000 mg | Freq: Once | INTRAMUSCULAR | Status: AC
  Administered 2015-11-23: 30 mg via INTRAVENOUS
  Filled 2015-11-23: qty 1

## 2015-11-23 MED ORDER — ACETAMINOPHEN 500 MG PO TABS
1000.0000 mg | ORAL_TABLET | Freq: Once | ORAL | Status: AC
  Administered 2015-11-23: 1000 mg via ORAL
  Filled 2015-11-23: qty 2

## 2015-11-23 MED ORDER — PROCHLORPERAZINE EDISYLATE 5 MG/ML IJ SOLN
10.0000 mg | Freq: Once | INTRAMUSCULAR | Status: AC
  Administered 2015-11-23: 10 mg via INTRAVENOUS
  Filled 2015-11-23: qty 2

## 2015-11-23 MED ORDER — DIPHENHYDRAMINE HCL 50 MG/ML IJ SOLN
25.0000 mg | Freq: Once | INTRAMUSCULAR | Status: AC
  Administered 2015-11-23: 25 mg via INTRAVENOUS
  Filled 2015-11-23: qty 1

## 2015-11-23 MED ORDER — METOCLOPRAMIDE HCL 5 MG/ML IJ SOLN
10.0000 mg | Freq: Once | INTRAMUSCULAR | Status: AC
  Administered 2015-11-23: 10 mg via INTRAVENOUS
  Filled 2015-11-23: qty 2

## 2015-11-23 MED ORDER — MAGNESIUM SULFATE 2 GM/50ML IV SOLN
2.0000 g | Freq: Once | INTRAVENOUS | Status: AC
  Administered 2015-11-23: 2 g via INTRAVENOUS
  Filled 2015-11-23: qty 50

## 2015-11-23 MED ORDER — SODIUM CHLORIDE 0.9 % IV BOLUS (SEPSIS)
1000.0000 mL | Freq: Once | INTRAVENOUS | Status: AC
  Administered 2015-11-23: 1000 mL via INTRAVENOUS

## 2015-11-23 NOTE — Discharge Instructions (Signed)
Continue taking your Clonopin. Follow-up with your PCP on Monday to discuss your other medications.  Take tylenol and ibuprofen for pain.You can take OTC loperamide for diarrhea as needed.  Return for worsening symptoms, including confusion, intractable vomiting, fever, escalating pain or any other symptoms concerning to you.

## 2015-11-23 NOTE — ED Triage Notes (Signed)
Patient states she stopped taking all medications 1 week ago. States she got tired of taking a hand full of pills. Denies SI/HI. Complains of headache.

## 2015-11-23 NOTE — ED Provider Notes (Signed)
[facility] Provider Note   CSN: [id number] Arrival date & time: 11/23/15  1755     History   Chief Complaint Chief Complaint  Patient presents with  . Withdrawal    HPI Kristine Vaughn is a 35 y.o. female.  HPI  35 year old female who presents with medication withdrawal. She has a history of bipolar disorder, PTSD, migraine headaches, and chronic back pain. States that she was taking significant amount of psychiatric medications and she did not think was helping her so she stopped taking all of these about a week ago. States that through 4 days later she began to have gradually increasing generalized headaches consistent with her typical migraine headaches. With associating nausea and one episode of vomiting yesterday. Has had arthralgias and myalgias, intermittent generalized abdominal pain, diarrhea, and chest pressure. No fevers or chills, urinary complaints, difficulty breathing, syncope or near syncope. States that she stopped all of her psychiatric medications aside from Klonopin. Took her triptan today for headache but no relief.   Past Medical History:  Diagnosis Date  . Bipolar disorder (HCC)   . Chronic back pain   . GERD (gastroesophageal reflux disease)   . IBS (irritable bowel syndrome)   . Migraines   . PTSD (post-traumatic stress disorder)     Patient Active Problem List   Diagnosis Date Noted  . Melena 06/23/2015  . Lower abdominal pain 06/23/2015  . Diarrhea 06/23/2015  . Hematemesis 06/23/2015  . Other hemorrhoids   . Hematochezia   . Mucosal abnormality of stomach   . Reflux esophagitis   . GERD (gastroesophageal reflux disease) 03/28/2015  . Dysphagia 03/28/2015  . Rectal bleeding 03/28/2015  . Constipation 03/28/2015    Past Surgical History:  Procedure Laterality Date  . BIOPSY  04/25/2015   Procedure: BIOPSY;  Surgeon: Corbin Ade, MD;  [facility];  Service: Endoscopy;;  . CERVICAL CONE BIOPSY    . COLONOSCOPY WITH  PROPOFOL N/A 04/25/2015   [id number] hemorrhoids otherwise norma; including normal distal TI  . ESOPHAGEAL DILATION  04/25/2015   Procedure: ESOPHAGEAL DILATION;  Surgeon: Corbin Ade, MD;  Location: [facility];  Service: Endoscopy;;  . ESOPHAGOGASTRODUODENOSCOPY (EGD) WITH PROPOFOL N/A 04/25/2015   [id number] reflux esophagitis and  abnormal gastric mucosa s/p bx (reactive gastritis/chemical gastritis)  . MYRINGOTOMY WITH TUBE PLACEMENT    . None to Date  03/28/15    OB History    No data available       Home Medications    Prior to Admission medications   Medication Sig Start Date End Date Taking? Authorizing Provider  amitriptyline (ELAVIL) 10 MG tablet Take 10 mg by mouth at bedtime.    Historical Provider, MD  buPROPion (WELLBUTRIN XL) 150 MG 24 hr tablet Take 450 mg by mouth daily.    Historical Provider, MD  CALCIUM PO Take 1 tablet by mouth daily.    Historical Provider, MD  clonazePAM (KLONOPIN) 1 MG tablet Take 1 mg by mouth 3 (three) times daily.     Historical Provider, MD  Cyanocobalamin (VITAMIN B-12 PO) Take 1 tablet by mouth daily.    Historical Provider, MD  dicyclomine (BENTYL) 10 MG capsule Take 1 capsule (10 mg total) by mouth 4 (four) times daily -  before meals and at bedtime. Patient not taking: Reported on 09/10/2015 06/30/15   Marticia Kocher, PA-C  ferrous sulfate 325 (65 FE) MG tablet Take 325 mg by mouth daily with breakfast.    Historical Provider, MD  HYDROcodone-acetaminophen (NORCO/VICODIN) 5-325 MG tablet Take 1 tablet by mouth daily as needed for moderate pain.  03/14/15   Historical Provider, MD  methocarbamol (ROBAXIN) 500 MG tablet Take 1 tablet (500 mg total) by mouth 3 (three) times daily. 11/13/15   Tammy Triplett, PA-C  MICROGESTIN 1.5-30 MG-MCG tablet Take 1 tablet by mouth daily.  09/02/15   Historical Provider, MD  naproxen (NAPROSYN) 500 MG tablet Take 1 tablet (500 mg total) by mouth 2 (two) times daily with a meal. 11/13/15   Tammy Triplett,  PA-C  ondansetron (ZOFRAN-ODT) 8 MG disintegrating tablet Take 8 mg by mouth every 8 (eight) hours as needed for nausea or vomiting.    Historical Provider, MD  pantoprazole (PROTONIX) 40 MG tablet Take 1 tablet (40 mg total) by mouth 2 (two) times daily before a meal. 06/23/15   Luvena KocherLeslie S Lewis, PA-C  pramipexole (MIRAPEX) 0.5 MG tablet Take 0.5 mg by mouth at bedtime.    Historical Provider, MD  propranolol (INDERAL) 60 MG tablet Take 60 mg by mouth 2 (two) times daily.    Historical Provider, MD  risperiDONE (RISPERDAL) 3 MG tablet Take 6 mg by mouth at bedtime.    Historical Provider, MD  rizatriptan (MAXALT) 10 MG tablet Take 10 mg by mouth as needed for migraine. May repeat in 2 hours if needed    Historical Provider, MD  sucralfate (CARAFATE) 1 g tablet Take 1 g by mouth 4 (four) times daily -  with meals and at bedtime.    Historical Provider, MD  topiramate (TOPAMAX) 100 MG tablet Take 100 mg by mouth 2 (two) times daily.    Historical Provider, MD  traZODone (DESYREL) 100 MG tablet Take 200-300 mg by mouth at bedtime as needed for sleep.     Historical Provider, MD  venlafaxine (EFFEXOR) 25 MG tablet Take 25 mg by mouth 2 (two) times daily.    Historical Provider, MD    Family History Family History  Problem Relation Age of Onset  . Colon cancer Neg Hx   . Celiac disease Neg Hx   . Inflammatory bowel disease Neg Hx     Social History Social History  Substance Use Topics  . Smoking status: Current Every Day Smoker    Packs/day: 0.50    Years: 17.00    Types: Cigarettes  . Smokeless tobacco: Never Used  . Alcohol use No     Allergies   Levsin [hyoscyamine]; Meclizine; and Microgestin 24 fe [norethin ace-eth estrad-fe]   Review of Systems Review of Systems 10/14 systems reviewed and are negative other than those stated in the HPI   Physical Exam Updated Vital Signs BP 112/78 (BP Location: Left Arm)   Pulse 83   Temp 98.6 F (37 C) (Oral)   Resp 20   Ht 5\' 6"   (1.676 m)   Wt 154 lb (69.9 kg)   LMP 11/11/2015 Comment: had tubes tied  SpO2 97%   BMI 24.86 kg/m   Physical Exam Physical Exam  Nursing note and vitals reviewed. Constitutional: Well developed, well nourished, non-toxic, and in no acute distress Head: Normocephalic and atraumatic.  Mouth/Throat: Oropharynx is clear and moist.  Neck: Normal range of motion. Neck supple.  Cardiovascular: Tachycardic rate and regular rhythm.   Pulmonary/Chest: Effort normal and breath sounds normal.  Abdominal: Soft. There is no tenderness. There is no rebound and no guarding.  Musculoskeletal: Normal range of motion.  Neurological: Alert, no facial droop, fluent speech, moves all extremities symmetrically Skin: Skin  is warm and dry.  Psychiatric: Cooperative   ED Treatments / Results  Labs (all labs ordered are listed, but only abnormal results are displayed) Labs Reviewed  COMPREHENSIVE METABOLIC PANEL - Abnormal; Notable for the following:       Result Value   BUN 5 (*)    Calcium 8.8 (*)    Total Protein 5.7 (*)    Albumin 3.0 (*)    AST 14 (*)    ALT 12 (*)    All other components within normal limits  CBC - Abnormal; Notable for the following:    Hemoglobin 11.8 (*)    All other components within normal limits  URINE RAPID DRUG SCREEN, HOSP PERFORMED - Abnormal; Notable for the following:    Tetrahydrocannabinol POSITIVE (*)    All other components within normal limits  ACETAMINOPHEN LEVEL - Abnormal; Notable for the following:    Acetaminophen (Tylenol), Serum <10 (*)    All other components within normal limits  SALICYLATE LEVEL  ETHANOL  LIPASE, BLOOD  I-STAT BETA HCG BLOOD, ED ([facility], [facility], [facility] ONLY)    EKG  EKG Interpretation  Date/Time:  Wednesday November 23 2015 19:22:06 EDT Ventricular Rate:  91 PR Interval:    QRS Duration: 83 QT Interval:  351 QTC Calculation: 432 R Axis:   75 Text Interpretation:  Sinus rhythm No significant change since last tracing Confirmed  by Frantz Quattrone MD, Anjeli Casad ([id number]) on 11/23/2015 7:41:16 PM       Radiology No results found.  Procedures Procedures (including critical care time)  Medications Ordered in ED Medications  sodium chloride 0.9 % bolus 1,000 mL (0 mLs Intravenous Stopped 11/23/15 2038)  ketorolac (TORADOL) 30 MG/ML injection 30 mg (30 mg Intravenous Given 11/23/15 1941)  prochlorperazine (COMPAZINE) injection 10 mg (10 mg Intravenous Given 11/23/15 1941)  diphenhydrAMINE (BENADRYL) injection 25 mg (25 mg Intravenous Given 11/23/15 1941)  acetaminophen (TYLENOL) tablet 1,000 mg (1,000 mg Oral Given 11/23/15 2136)  magnesium sulfate IVPB 2 g 50 mL (0 g Intravenous Stopped 11/23/15 2238)  metoCLOPramide (REGLAN) injection 10 mg (10 mg Intravenous Given 11/23/15 2141)  dexamethasone (DECADRON) injection 10 mg (10 mg Intravenous Given 11/23/15 2139)     Initial Impression / Assessment and Plan / ED Course  I have reviewed the triage vital signs and the nursing notes.  Pertinent labs & imaging results that were available during my care of the patient were reviewed by me and considered in my medical decision making (see chart for details).  Clinical Course    She discontinued all psych meds but still taking Klonopin. No concern for benzodiazepine withdrawal. Vital signs stable. She is non-toxic and in no acute distress. Exam overall non-focal and non-concerning. Not concerned for life-threatening withdrawal. Treated for migraine headache with migraine cocktail x 2, with improved headache and she is able to nap here in ED. Basic blood work overall unremarkable.  She feels improved after symptomatic treatment. I feel she is stable for discharge home. She has PCP follow-up on Monday to go over medications.   The patient appears reasonably screened and/or stabilized for discharge and I doubt any other medical condition or other [facility] requiring further screening, evaluation, or treatment in the ED at this time prior to  discharge.  Strict return and follow-up instructions reviewed. She expressed understanding of all discharge instructions and felt comfortable with the plan of care.   Final Clinical Impressions(s) / ED Diagnoses   Final diagnoses:  Other migraine without status migrainosus, not intractable  Diarrhea, unspecified type  Withdrawal from other psychoactive substance [facility])    New Prescriptions New Prescriptions   No medications on file     Lavera Guise, MD 11/24/15 0130

## 2015-12-04 DIAGNOSIS — Z79899 Other long term (current) drug therapy: Secondary | ICD-10-CM | POA: Insufficient documentation

## 2015-12-04 DIAGNOSIS — F17210 Nicotine dependence, cigarettes, uncomplicated: Secondary | ICD-10-CM | POA: Diagnosis not present

## 2015-12-04 DIAGNOSIS — Z791 Long term (current) use of non-steroidal anti-inflammatories (NSAID): Secondary | ICD-10-CM | POA: Insufficient documentation

## 2015-12-04 DIAGNOSIS — G43909 Migraine, unspecified, not intractable, without status migrainosus: Secondary | ICD-10-CM | POA: Diagnosis present

## 2015-12-04 DIAGNOSIS — G43809 Other migraine, not intractable, without status migrainosus: Secondary | ICD-10-CM | POA: Diagnosis not present

## 2015-12-05 ENCOUNTER — Encounter (HOSPITAL_COMMUNITY): Payer: Self-pay | Admitting: *Deleted

## 2015-12-05 ENCOUNTER — Emergency Department (HOSPITAL_COMMUNITY)
Admission: EM | Admit: 2015-12-05 | Discharge: 2015-12-05 | Disposition: A | Discharge status: Home / Self Care | Payer: Medicaid - Out of State | Attending: Emergency Medicine | Admitting: Emergency Medicine

## 2015-12-05 DIAGNOSIS — G43809 Other migraine, not intractable, without status migrainosus: Secondary | ICD-10-CM

## 2015-12-05 MED ORDER — MAGNESIUM SULFATE 2 GM/50ML IV SOLN
2.0000 g | Freq: Once | INTRAVENOUS | Status: AC
  Administered 2015-12-05: 2 g via INTRAVENOUS
  Filled 2015-12-05: qty 50

## 2015-12-05 MED ORDER — KETOROLAC TROMETHAMINE 30 MG/ML IJ SOLN
30.0000 mg | Freq: Once | INTRAMUSCULAR | Status: AC
  Administered 2015-12-05: 30 mg via INTRAVENOUS
  Filled 2015-12-05: qty 1

## 2015-12-05 MED ORDER — ACETAMINOPHEN 500 MG PO TABS
1000.0000 mg | ORAL_TABLET | Freq: Once | ORAL | Status: AC
  Administered 2015-12-05: 1000 mg via ORAL
  Filled 2015-12-05: qty 2

## 2015-12-05 MED ORDER — DEXAMETHASONE SODIUM PHOSPHATE 10 MG/ML IJ SOLN
10.0000 mg | Freq: Once | INTRAMUSCULAR | Status: AC
  Administered 2015-12-05: 10 mg via INTRAVENOUS
  Filled 2015-12-05: qty 1

## 2015-12-05 MED ORDER — PROCHLORPERAZINE EDISYLATE 5 MG/ML IJ SOLN
10.0000 mg | Freq: Once | INTRAMUSCULAR | Status: AC
  Administered 2015-12-05: 10 mg via INTRAVENOUS
  Filled 2015-12-05: qty 2

## 2015-12-05 MED ORDER — SODIUM CHLORIDE 0.9 % IV BOLUS (SEPSIS)
1000.0000 mL | Freq: Once | INTRAVENOUS | Status: AC
  Administered 2015-12-05: 1000 mL via INTRAVENOUS

## 2015-12-05 MED ORDER — DIPHENHYDRAMINE HCL 50 MG/ML IJ SOLN
25.0000 mg | Freq: Once | INTRAMUSCULAR | Status: AC
  Administered 2015-12-05: 25 mg via INTRAVENOUS
  Filled 2015-12-05: qty 1

## 2015-12-05 NOTE — ED Triage Notes (Signed)
Pt c/o migraine headache for the past 5 days, sob that started last night, was seen at [facility] last for same, was given Toradol, reglan and benadryl with no improvement in symptoms,

## 2015-12-05 NOTE — ED Notes (Signed)
MD at bedside. 

## 2015-12-05 NOTE — ED Provider Notes (Signed)
[facility] Provider Note   CSN: [id number] Arrival date & time: 12/04/15  2309     History   Chief Complaint Chief Complaint  Patient presents with  . Migraine    HPI Kristine Givensiffany Vaughn is a [age] y.o. female.  Patient presents to the ER for evaluation of migraine headache. She reports she has been having migraine for the last 5 days. Pain is across the top and posterior aspect of her scalp. She reports it as a constant throbbing pain with associated nausea, no vomiting. There is no fever or neck pain.      Past Medical History:  Diagnosis Date  . Bipolar disorder (HCC)   . Chronic back pain   . GERD (gastroesophageal reflux disease)   . IBS (irritable bowel syndrome)   . Migraines   . PTSD (post-traumatic stress disorder)     Patient Active Problem List   Diagnosis Date Noted  . Melena 06/23/2015  . Lower abdominal pain 06/23/2015  . Diarrhea 06/23/2015  . Hematemesis 06/23/2015  . Other hemorrhoids   . Hematochezia   . Mucosal abnormality of stomach   . Reflux esophagitis   . GERD (gastroesophageal reflux disease) 03/28/2015  . Dysphagia 03/28/2015  . Rectal bleeding 03/28/2015  . Constipation 03/28/2015    Past Surgical History:  Procedure Laterality Date  . BIOPSY  04/25/2015   Procedure: BIOPSY;  Surgeon: Corbin Adeobert M Rourk, MD;  Location: [facility];  Service: Endoscopy;;  . CERVICAL CONE BIOPSY    . COLONOSCOPY WITH PROPOFOL N/A 04/25/2015   [id number]:external hemorrhoids otherwise norma; including normal distal TI  . ESOPHAGEAL DILATION  04/25/2015   Procedure: ESOPHAGEAL DILATION;  Surgeon: Corbin Adeobert M Rourk, MD;  Location: [facility];  Service: Endoscopy;;  . ESOPHAGOGASTRODUODENOSCOPY (EGD) WITH PROPOFOL N/A 04/25/2015   [id number]:erosive reflux esophagitis and  abnormal gastric mucosa s/p bx (reactive gastritis/chemical gastritis)  . MYRINGOTOMY WITH TUBE PLACEMENT    . None to Date  03/28/15    OB History    No data available       Home  Medications    Prior to Admission medications   Medication Sig Start Date End Date Taking? Authorizing Provider  clonazePAM (KLONOPIN) 1 MG tablet Take 1 mg by mouth 3 (three) times daily.    Yes Historical Provider, MD  methocarbamol (ROBAXIN) 500 MG tablet Take 1 tablet (500 mg total) by mouth 3 (three) times daily. 11/13/15  Yes Tammy Triplett, PA-C  naproxen (NAPROSYN) 500 MG tablet Take 1 tablet (500 mg total) by mouth 2 (two) times daily with a meal. 11/13/15  Yes Tammy Triplett, PA-C  ondansetron (ZOFRAN-ODT) 8 MG disintegrating tablet Take 8 mg by mouth every 8 (eight) hours as needed for nausea or vomiting.   Yes Historical Provider, MD  pantoprazole (PROTONIX) 40 MG tablet Take 1 tablet (40 mg total) by mouth 2 (two) times daily before a meal. 06/23/15  Yes Amiya KocherLeslie S Lewis, PA-C  rizatriptan (MAXALT) 10 MG tablet Take 10 mg by mouth as needed for migraine. May repeat in 2 hours if needed   Yes Historical Provider, MD  topiramate (TOPAMAX) 100 MG tablet Take 100 mg by mouth 2 (two) times daily.   Yes Historical Provider, MD  amitriptyline (ELAVIL) 10 MG tablet Take 10 mg by mouth at bedtime.    Historical Provider, MD  buPROPion (WELLBUTRIN XL) 150 MG 24 hr tablet Take 450 mg by mouth daily.    Historical Provider, MD  CALCIUM PO Take 1 tablet by  mouth daily.    Historical Provider, MD  Cyanocobalamin (VITAMIN B-12 PO) Take 1 tablet by mouth daily.    Historical Provider, MD  dicyclomine (BENTYL) 10 MG capsule Take 1 capsule (10 mg total) by mouth 4 (four) times daily -  before meals and at bedtime. Patient not taking: Reported on 09/10/2015 06/30/15   Larissa Kocher, PA-C  ferrous sulfate 325 (65 FE) MG tablet Take 325 mg by mouth daily with breakfast.    Historical Provider, MD  HYDROcodone-acetaminophen (NORCO/VICODIN) 5-325 MG tablet Take 1 tablet by mouth daily as needed for moderate pain.  03/14/15   Historical Provider, MD  MICROGESTIN 1.5-30 MG-MCG tablet Take 1 tablet by mouth daily.   09/02/15   Historical Provider, MD  pramipexole (MIRAPEX) 0.5 MG tablet Take 0.5 mg by mouth at bedtime.    Historical Provider, MD  propranolol (INDERAL) 60 MG tablet Take 60 mg by mouth 2 (two) times daily.    Historical Provider, MD  risperiDONE (RISPERDAL) 3 MG tablet Take 6 mg by mouth at bedtime.    Historical Provider, MD  sucralfate (CARAFATE) 1 g tablet Take 1 g by mouth 4 (four) times daily -  with meals and at bedtime.    Historical Provider, MD  traZODone (DESYREL) 100 MG tablet Take 200-300 mg by mouth at bedtime as needed for sleep.     Historical Provider, MD  venlafaxine (EFFEXOR) 25 MG tablet Take 25 mg by mouth 2 (two) times daily.    Historical Provider, MD    Family History Family History  Problem Relation Age of Onset  . Colon cancer Neg Hx   . Celiac disease Neg Hx   . Inflammatory bowel disease Neg Hx     Social History Social History  Substance Use Topics  . Smoking status: Current Every Day Smoker    Packs/day: 0.50    Years: 17.00    Types: Cigarettes  . Smokeless tobacco: Never Used  . Alcohol use No     Allergies   Levsin [hyoscyamine]; Meclizine; and Microgestin 24 fe [norethin ace-eth estrad-fe]   Review of Systems Review of Systems  Gastrointestinal: Positive for nausea.  Neurological: Positive for headaches.  All other systems reviewed and are negative.    Physical Exam Updated Vital Signs BP 131/92   Pulse 101   Temp 97.8 F (36.6 C) (Oral)   Resp 18   Ht 5\' 6"  (1.676 m)   Wt 150 lb (68 kg)   LMP 11/11/2015 Comment: had tubes tied  SpO2 100%   BMI 24.21 kg/m   Physical Exam  Constitutional: She is oriented to person, place, and time. She appears well-developed and well-nourished. No distress.  HENT:  Head: Normocephalic and atraumatic.  Right Ear: Hearing normal.  Left Ear: Hearing normal.  Nose: Nose normal.  Mouth/Throat: Oropharynx is clear and moist and mucous membranes are normal.  Eyes: Conjunctivae and EOM are  normal. Pupils are equal, round, and reactive to light.  Neck: Normal range of motion. Neck supple.  Cardiovascular: Regular rhythm, S1 normal and S2 normal.  Exam reveals no gallop and no friction rub.   No murmur heard. Pulmonary/Chest: Effort normal and breath sounds normal. No respiratory distress. She exhibits no tenderness.  Abdominal: Soft. Normal appearance and bowel sounds are normal. There is no hepatosplenomegaly. There is no tenderness. There is no rebound, no guarding, no tenderness at McBurney's point and negative Murphy's sign. No hernia.  Musculoskeletal: Normal range of motion.  Neurological: She is alert  and oriented to person, place, and time. She has normal strength. No cranial nerve deficit or sensory deficit. Coordination normal. GCS eye subscore is 4. GCS verbal subscore is 5. GCS motor subscore is 6.  Extraocular muscle movement: normal No visual field cut Pupils: equal and reactive both direct and consensual response is normal No nystagmus present    Sensory function is intact to light touch, pinprick Proprioception intact  Grip strength 5/5 symmetric in upper extremities No pronator drift Normal finger to nose bilaterally  Lower extremity strength 5/5 against gravity Normal heel to shin bilaterally  Gait: normal   Skin: Skin is warm, dry and intact. No rash noted. No cyanosis.  Psychiatric: She has a normal mood and affect. Her speech is normal and behavior is normal. Thought content normal.  Nursing note and vitals reviewed.    ED Treatments / Results  Labs (all labs ordered are listed, but only abnormal results are displayed) Labs Reviewed - No data to display  EKG  EKG Interpretation None       Radiology No results found.  Procedures Procedures (including critical care time)  Medications Ordered in ED Medications  sodium chloride 0.9 % bolus 1,000 mL (not administered)  ketorolac (TORADOL) 30 MG/ML injection 30 mg (not administered)   dexamethasone (DECADRON) injection 10 mg (not administered)  prochlorperazine (COMPAZINE) injection 10 mg (not administered)  diphenhydrAMINE (BENADRYL) injection 25 mg (not administered)  magnesium sulfate IVPB 2 g 50 mL (not administered)  acetaminophen (TYLENOL) tablet 1,000 mg (not administered)     Initial Impression / Assessment and Plan / ED Course  I have reviewed the triage vital signs and the nursing notes.  Pertinent labs & imaging results that were available during my care of the patient were reviewed by me and considered in my medical decision making (see chart for details).  Clinical Course   Patient presents to the emergency department for evaluation of migraine headache. Patient is currently experiencing a headache that is similar to previous migraines. Patient does not have any unusual features compared to previous migraines. Patient has normal neurologic examination. There are no unusual features, such as unusual intensity or sudden onset. As this headache is similar to previous migraines, there is no concern for subarachnoid hemorrhage or other etiology. Patient therefore does not require imaging. Patient treated as migraine headache.  Final Clinical Impressions(s) / ED Diagnoses   Final diagnoses:  Other migraine without status migrainosus, not intractable    New Prescriptions New Prescriptions   No medications on file     Gilda Crease, MD 12/05/15 0134

## 2015-12-11 ENCOUNTER — Emergency Department (HOSPITAL_COMMUNITY): Payer: Medicaid - Out of State

## 2015-12-11 ENCOUNTER — Emergency Department (HOSPITAL_COMMUNITY)
Admission: EM | Admit: 2015-12-11 | Discharge: 2015-12-11 | Disposition: A | Discharge status: Home / Self Care | Payer: Medicaid - Out of State | Attending: Emergency Medicine | Admitting: Emergency Medicine

## 2015-12-11 ENCOUNTER — Encounter (HOSPITAL_COMMUNITY): Payer: Self-pay | Admitting: *Deleted

## 2015-12-11 DIAGNOSIS — F17210 Nicotine dependence, cigarettes, uncomplicated: Secondary | ICD-10-CM | POA: Diagnosis not present

## 2015-12-11 DIAGNOSIS — Z79899 Other long term (current) drug therapy: Secondary | ICD-10-CM | POA: Diagnosis not present

## 2015-12-11 DIAGNOSIS — R0789 Other chest pain: Secondary | ICD-10-CM | POA: Diagnosis present

## 2015-12-11 DIAGNOSIS — G43001 Migraine without aura, not intractable, with status migrainosus: Secondary | ICD-10-CM | POA: Insufficient documentation

## 2015-12-11 LAB — BASIC METABOLIC PANEL
Anion gap: 4 — ABNORMAL LOW (ref 5–15)
BUN: 7 mg/dL (ref 6–20)
CHLORIDE: 113 mmol/L — AB (ref 101–111)
CO2: 20 mmol/L — AB (ref 22–32)
CREATININE: 0.83 mg/dL (ref 0.44–1.00)
Calcium: 8.9 mg/dL (ref 8.9–10.3)
GFR calc Af Amer: >60 mL/min (ref >60)
GFR calc non Af Amer: >60 mL/min (ref >60)
GLUCOSE: 90 mg/dL (ref 65–99)
POTASSIUM: 3.9 mmol/L (ref 3.5–5.1)
Sodium: 137 mmol/L (ref 135–145)

## 2015-12-11 LAB — CBC
HEMATOCRIT: 40.7 % (ref 36.0–46.0)
Hemoglobin: 13.5 g/dL (ref 12.0–15.0)
MCH: 30.8 pg (ref 26.0–34.0)
MCHC: 33.2 g/dL (ref 30.0–36.0)
MCV: 92.9 fL (ref 78.0–100.0)
Platelets: 232 10*3/uL (ref 150–400)
RBC: 4.38 MIL/uL (ref 3.87–5.11)
RDW: 14 % (ref 11.5–15.5)
WBC: 8.2 10*3/uL (ref 4.0–10.5)

## 2015-12-11 LAB — TROPONIN I: Troponin I: <0.03 ng/mL (ref <0.03)

## 2015-12-11 MED ORDER — PROCHLORPERAZINE EDISYLATE 5 MG/ML IJ SOLN
10.0000 mg | Freq: Once | INTRAMUSCULAR | Status: AC
  Administered 2015-12-11: 10 mg via INTRAVENOUS
  Filled 2015-12-11: qty 2

## 2015-12-11 MED ORDER — SODIUM CHLORIDE 0.9 % IV BOLUS (SEPSIS)
1000.0000 mL | Freq: Once | INTRAVENOUS | Status: AC
  Administered 2015-12-11: 1000 mL via INTRAVENOUS

## 2015-12-11 MED ORDER — KETOROLAC TROMETHAMINE 30 MG/ML IJ SOLN
30.0000 mg | Freq: Once | INTRAMUSCULAR | Status: AC
  Administered 2015-12-11: 30 mg via INTRAVENOUS
  Filled 2015-12-11: qty 1

## 2015-12-11 MED ORDER — DIPHENHYDRAMINE HCL 50 MG/ML IJ SOLN
25.0000 mg | Freq: Once | INTRAMUSCULAR | Status: AC
  Administered 2015-12-11: 25 mg via INTRAVENOUS
  Filled 2015-12-11: qty 1

## 2015-12-11 NOTE — ED Triage Notes (Signed)
Pt comes a headache that started 2 weeks ago. Pt states she was recently taken off a lot of her medications. She was taken off by her PCP. This morning patient began to have left chest pain.

## 2015-12-11 NOTE — ED Provider Notes (Signed)
[facility] Provider Note   CSN: [id number] Arrival date & time: 12/11/15  1537     History   Chief Complaint Chief Complaint  Patient presents with  . Chest Pain  . Headache    HPI Kristine Vaughn is a 35 y.o. female.Presents today complaining of migraine headache. She states that she has had a migraine headache approximately 2 weeks. She has been on multiple medications which she has mostly stopped. She states that she was feeling too tired and appeared drugged at work. She has continued her Klonopin and Topamax. She was seen here several days ago and received a migraine cocktail which she states made her feel better at that time. However headache has returned. She has not had fever, visual changes, lateralized deficits, weakness, or trauma. She also states that she has some sharp left-sided chest pain. This has been present for several days and continuous. She denies any cough or dyspnea. She has no history of PE or DVT. She has no history of chest wall trauma. She has not had fever or cough.  HPI  Past Medical History:  Diagnosis Date  . Bipolar disorder (HCC)   . Chronic back pain   . GERD (gastroesophageal reflux disease)   . IBS (irritable bowel syndrome)   . Migraines   . PTSD (post-traumatic stress disorder)     Patient Active Problem List   Diagnosis Date Noted  . Melena 06/23/2015  . Lower abdominal pain 06/23/2015  . Diarrhea 06/23/2015  . Hematemesis 06/23/2015  . Other hemorrhoids   . Hematochezia   . Mucosal abnormality of stomach   . Reflux esophagitis   . GERD (gastroesophageal reflux disease) 03/28/2015  . Dysphagia 03/28/2015  . Rectal bleeding 03/28/2015  . Constipation 03/28/2015    Past Surgical History:  Procedure Laterality Date  . BIOPSY  04/25/2015   Procedure: BIOPSY;  Surgeon: Corbin Ade, MD;  Location: [facility];  Service: Endoscopy;;  . CERVICAL CONE BIOPSY    . COLONOSCOPY WITH PROPOFOL N/A 04/25/2015   [id number]  hemorrhoids otherwise norma; including normal distal TI  . ESOPHAGEAL DILATION  04/25/2015   Procedure: ESOPHAGEAL DILATION;  Surgeon: Corbin Ade, MD;  Location: [facility];  Service: Endoscopy;;  . ESOPHAGOGASTRODUODENOSCOPY (EGD) WITH PROPOFOL N/A 04/25/2015   [id number]:[id number] reflux esophagitis and  abnormal gastric mucosa s/p bx (reactive gastritis/chemical gastritis)  . MYRINGOTOMY WITH TUBE PLACEMENT    . None to Date  03/28/15    OB History    No data available       Home Medications    Prior to Admission medications   Medication Sig Start Date End Date Taking? Authorizing Provider  clonazePAM (KLONOPIN) 1 MG tablet Take 1 mg by mouth 3 (three) times daily.    Yes Historical Provider, MD  ondansetron (ZOFRAN-ODT) 8 MG disintegrating tablet Take 8 mg by mouth every 8 (eight) hours as needed for nausea or vomiting.   Yes Historical Provider, MD  pantoprazole (PROTONIX) 40 MG tablet Take 1 tablet (40 mg total) by mouth 2 (two) times daily before a meal. 06/23/15  Yes Tiaunna Kocher, PA-C  topiramate (TOPAMAX) 100 MG tablet Take 100 mg by mouth 2 (two) times daily.   Yes Historical Provider, MD    Family History Family History  Problem Relation Age of Onset  . Colon cancer Neg Hx   . Celiac disease Neg Hx   . Inflammatory bowel disease Neg Hx     Social History Social History  Substance Use Topics  . Smoking status: Current Every Day Smoker    Packs/day: 0.50    Years: 17.00    Types: Cigarettes  . Smokeless tobacco: Never Used  . Alcohol use No     Allergies   Levsin [hyoscyamine]; Meclizine; and Microgestin 24 fe [norethin ace-eth estrad-fe]   Review of Systems Review of Systems  All other systems reviewed and are negative.    Physical Exam Updated Vital Signs BP 111/84   Pulse 93   Temp 97.9 F (36.6 C) (Oral)   Resp 18   Ht 5\' 6"  (1.676 m)   Wt 68 kg   LMP 11/11/2015 Comment: had tubes tied  SpO2 99%   BMI 24.21 kg/m   Physical Exam    Constitutional: She is oriented to person, place, and time. She appears well-developed and well-nourished. No distress.  HENT:  Head: Normocephalic and atraumatic.  Right Ear: External ear normal.  Left Ear: External ear normal.  Nose: Nose normal.  Eyes: Conjunctivae and EOM are normal. Pupils are equal, round, and reactive to light.  Neck: Normal range of motion. Neck supple.  Cardiovascular: Normal rate, regular rhythm, normal heart sounds and intact distal pulses.   Pulmonary/Chest: Effort normal and breath sounds normal. She exhibits tenderness.    Musculoskeletal: Normal range of motion.  Neurological: She is alert and oriented to person, place, and time. She exhibits normal muscle tone. Coordination normal.  Skin: Skin is warm and dry.  Psychiatric: She has a normal mood and affect. Her behavior is normal. Thought content normal.  Nursing note and vitals reviewed.    ED Treatments / Results  Labs (all labs ordered are listed, but only abnormal results are displayed) Labs Reviewed  BASIC METABOLIC PANEL - Abnormal; Notable for the following:       Result Value   Chloride 113 (*)    CO2 20 (*)    Anion gap 4 (*)    All other components within normal limits  CBC  TROPONIN I    EKG  EKG Interpretation  Date/Time:  Sunday December 11 2015 15:43:19 EDT Ventricular Rate:  97 PR Interval:  136 QRS Duration: 74 QT Interval:  338 QTC Calculation: 429 R Axis:   77 Text Interpretation:  Normal sinus rhythm Normal ECG Confirmed by Danaija Eskridge MD, Duwayne Heck [phone]) on 12/11/2015 4:59:31 PM       Radiology Dg Chest 2 View  Result Date: 12/11/2015 CLINICAL DATA:  Chest pain since Saturday night a week ago, SOB EXAM: CHEST  2 VIEW COMPARISON:  Chest x-Heidi Maclin dated 09/10/2015. FINDINGS: The heart size and mediastinal contours are within normal limits. Both lungs are clear. No pleural effusion or pneumothorax seen. The visualized skeletal structures are unremarkable. IMPRESSION: No  active cardiopulmonary disease.  No evidence of pneumonia. Electronically Signed   By: Bary Richard M.D.   On: 12/11/2015 16:09    Procedures Procedures (including critical care time)  Medications Ordered in ED Medications  sodium chloride 0.9 % bolus 1,000 mL (1,000 mLs Intravenous New Bag/Given 12/11/15 1622)  prochlorperazine (COMPAZINE) injection 10 mg (10 mg Intravenous Given 12/11/15 1621)  diphenhydrAMINE (BENADRYL) injection 25 mg (25 mg Intravenous Given 12/11/15 1621)  ketorolac (TORADOL) 30 MG/ML injection 30 mg (30 mg Intravenous Given 12/11/15 1621)     Initial Impression / Assessment and Plan / ED Course  I have reviewed the triage vital signs and the nursing notes.  Pertinent labs & imaging results that were available during my care of the  patient were reviewed by me and considered in my medical decision making (see chart for details).  Clinical Course    1 migraine headache patient given IV fluids, Compazine, Benadryl, Toradol and feels improved. She is advised to follow-up with her primary care physician or neurologist regarding her migraine headaches. She recently had a CT of the head and I do not see any indication for further imaging here today. 2 chest wall pain. Patient perked negative for PE. EKG is normal and I have a low index of suspicion for cardiac etiology.  Final Clinical Impressions(s) / ED Diagnoses   Final diagnoses:  None    New Prescriptions New Prescriptions   No medications on file     Margarita Grizzleanielle Khani Paino, MD 12/11/15 1706

## 2016-02-16 ENCOUNTER — Emergency Department (HOSPITAL_COMMUNITY)
Admission: EM | Admit: 2016-02-16 | Discharge: 2016-02-17 | Disposition: A | Discharge status: Home / Self Care | Payer: Medicaid - Out of State | Attending: Emergency Medicine | Admitting: Emergency Medicine

## 2016-02-16 ENCOUNTER — Encounter (HOSPITAL_COMMUNITY): Payer: Self-pay | Admitting: Emergency Medicine

## 2016-02-16 DIAGNOSIS — R109 Unspecified abdominal pain: Secondary | ICD-10-CM | POA: Insufficient documentation

## 2016-02-16 DIAGNOSIS — R0602 Shortness of breath: Secondary | ICD-10-CM | POA: Insufficient documentation

## 2016-02-16 DIAGNOSIS — R5383 Other fatigue: Secondary | ICD-10-CM | POA: Insufficient documentation

## 2016-02-16 DIAGNOSIS — Z79899 Other long term (current) drug therapy: Secondary | ICD-10-CM | POA: Diagnosis not present

## 2016-02-16 DIAGNOSIS — R42 Dizziness and giddiness: Secondary | ICD-10-CM | POA: Insufficient documentation

## 2016-02-16 DIAGNOSIS — R202 Paresthesia of skin: Secondary | ICD-10-CM | POA: Diagnosis not present

## 2016-02-16 DIAGNOSIS — R200 Anesthesia of skin: Secondary | ICD-10-CM | POA: Diagnosis present

## 2016-02-16 DIAGNOSIS — F17210 Nicotine dependence, cigarettes, uncomplicated: Secondary | ICD-10-CM | POA: Diagnosis not present

## 2016-02-16 LAB — CBC
HEMATOCRIT: 41.1 % (ref 36.0–46.0)
HEMOGLOBIN: 13.7 g/dL (ref 12.0–15.0)
MCH: 30.6 pg (ref 26.0–34.0)
MCHC: 33.3 g/dL (ref 30.0–36.0)
MCV: 91.7 fL (ref 78.0–100.0)
Platelets: 167 10*3/uL (ref 150–400)
RBC: 4.48 MIL/uL (ref 3.87–5.11)
RDW: 12.8 % (ref 11.5–15.5)
WBC: 6.5 10*3/uL (ref 4.0–10.5)

## 2016-02-16 LAB — BASIC METABOLIC PANEL
Anion gap: 6 (ref 5–15)
BUN: 6 mg/dL (ref 6–20)
CHLORIDE: 114 mmol/L — AB (ref 101–111)
CO2: 19 mmol/L — AB (ref 22–32)
Calcium: 9.2 mg/dL (ref 8.9–10.3)
Creatinine, Ser: 0.9 mg/dL (ref 0.44–1.00)
GFR calc non Af Amer: >60 mL/min (ref >60)
Glucose, Bld: 91 mg/dL (ref 65–99)
POTASSIUM: 3.1 mmol/L — AB (ref 3.5–5.1)
SODIUM: 139 mmol/L (ref 135–145)

## 2016-02-16 MED ORDER — POTASSIUM CHLORIDE CRYS ER 20 MEQ PO TBCR
40.0000 meq | EXTENDED_RELEASE_TABLET | Freq: Once | ORAL | Status: AC
  Administered 2016-02-17: 40 meq via ORAL
  Filled 2016-02-16: qty 2

## 2016-02-16 MED ORDER — SODIUM CHLORIDE 0.9 % IV BOLUS (SEPSIS)
1000.0000 mL | Freq: Once | INTRAVENOUS | Status: AC
  Administered 2016-02-17: 1000 mL via INTRAVENOUS

## 2016-02-16 NOTE — ED Triage Notes (Signed)
Pt reports numbness that has been going on "for quite a while" but pt states today her entire face has been numb and she has had alternating numbness in her hands and feet. Pt reports numbness has occurred on both sides. Pt reports 15 lb weight loss in 2 weeks, unintentional.
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
## 2016-02-17 ENCOUNTER — Emergency Department (HOSPITAL_COMMUNITY): Payer: Medicaid - Out of State

## 2016-02-17 LAB — URINALYSIS, ROUTINE W REFLEX MICROSCOPIC
Bilirubin Urine: NEGATIVE
Glucose, UA: NEGATIVE mg/dL
KETONES UR: NEGATIVE mg/dL
LEUKOCYTES UA: NEGATIVE
NITRITE: NEGATIVE
PROTEIN: NEGATIVE mg/dL
Specific Gravity, Urine: 1.008 (ref 1.005–1.030)
pH: 6 (ref 5.0–8.0)

## 2016-02-17 LAB — I-STAT BETA HCG BLOOD, ED (MC, WL, AP ONLY): I-stat hCG, quantitative: <5 m[IU]/mL (ref <5)

## 2016-02-17 LAB — MAGNESIUM: Magnesium: 1.9 mg/dL (ref 1.7–2.4)

## 2016-02-17 MED ORDER — DIPHENHYDRAMINE HCL 50 MG/ML IJ SOLN
25.0000 mg | Freq: Once | INTRAMUSCULAR | Status: AC
  Administered 2016-02-17: 25 mg via INTRAVENOUS
  Filled 2016-02-17: qty 1

## 2016-02-17 MED ORDER — POTASSIUM CHLORIDE ER 10 MEQ PO TBCR: 10.0000 meq | EXTENDED_RELEASE_TABLET | Freq: Two times a day (BID) | ORAL | 0 refills | Status: AC

## 2016-02-17 MED ORDER — KETOROLAC TROMETHAMINE 30 MG/ML IJ SOLN
30.0000 mg | Freq: Once | INTRAMUSCULAR | Status: AC
  Administered 2016-02-17: 30 mg via INTRAVENOUS
  Filled 2016-02-17: qty 1

## 2016-02-17 MED ORDER — METOCLOPRAMIDE HCL 5 MG/ML IJ SOLN
10.0000 mg | Freq: Once | INTRAMUSCULAR | Status: AC
  Administered 2016-02-17: 10 mg via INTRAVENOUS
  Filled 2016-02-17: qty 2

## 2016-02-17 NOTE — ED Notes (Signed)
Pt given ginger ale.

## 2016-02-17 NOTE — ED Notes (Signed)
Pt not in room for discharge. Pt's discharge papers left in charge nurse desk basket.

## 2016-02-17 NOTE — Discharge Instructions (Signed)
Follow-up with the neurologist as scheduled.  Return to the ED if you develop new or worsening symptoms. °

## 2016-08-21 IMAGING — CT CT RENAL STONE PROTOCOL
2 of 4 series · 16 of 46 positions shown, 18 images · non-contrast
Comparison: CT scan of June 27, 2015.

CLINICAL DATA: Chronic right lower quadrant abdominal pain.

EXAM:
CT ABDOMEN AND PELVIS WITHOUT CONTRAST
TECHNIQUE: Multidetector CT imaging of the abdomen and pelvis was performed
following the standard protocol without IV contrast.

[Series 2: routine abd pel with · axial · 0.65mm/px · z∈[-648,-202]mm · 13 of 99 slices shown, 15 images]
[im 5/99  soft-tissue]
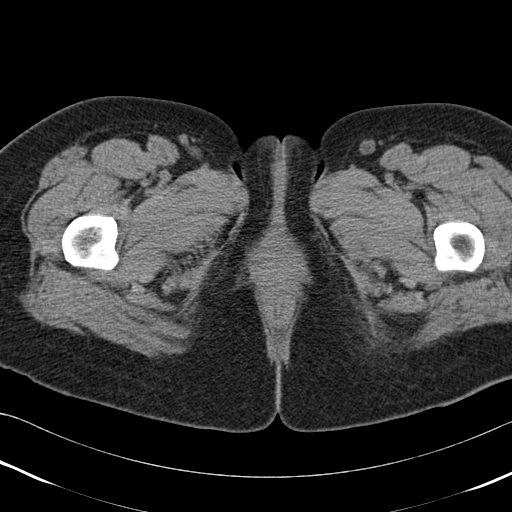
[im 5/99  bone]
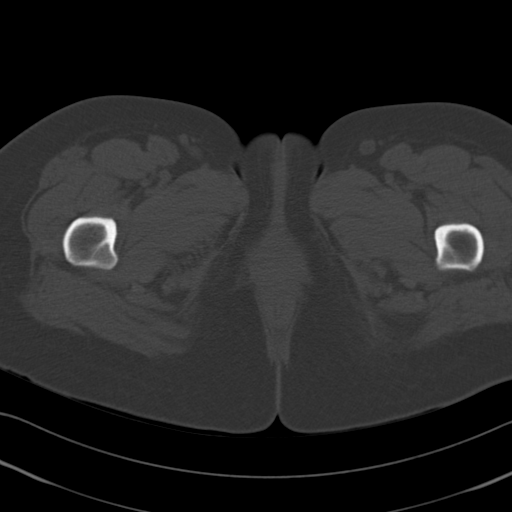
[im 13/99  soft-tissue]
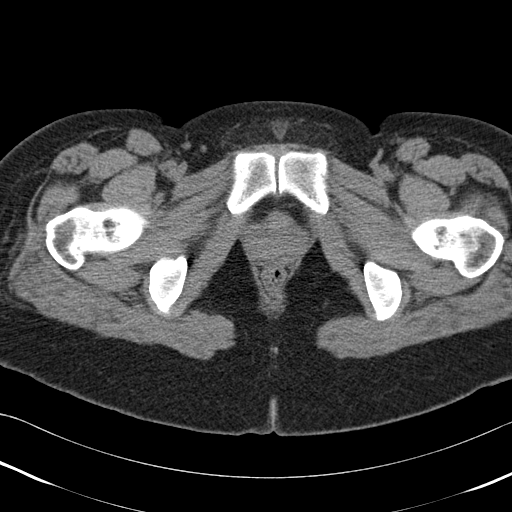
[im 21/99  soft-tissue]
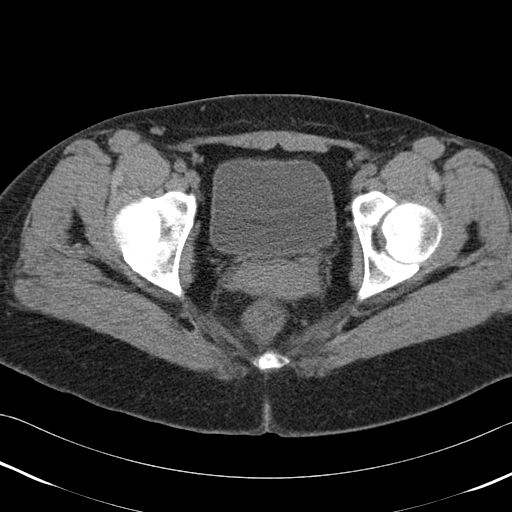
[im 29/99  soft-tissue]
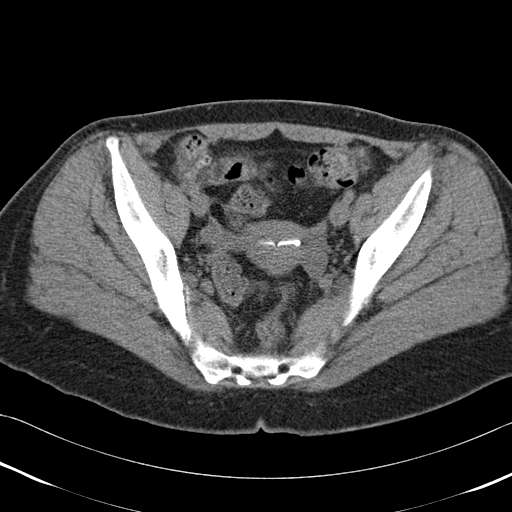
[im 33/99  soft-tissue]
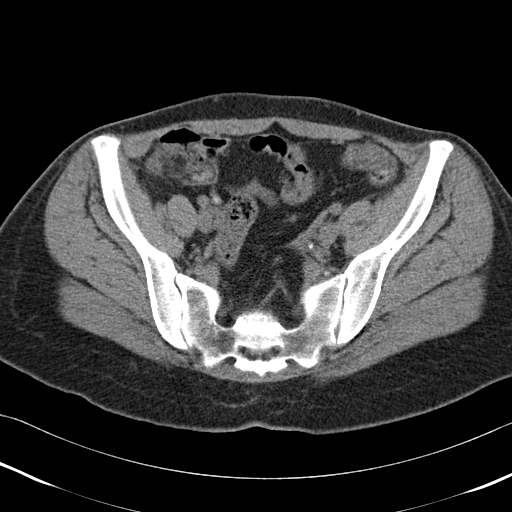
[im 41/99  soft-tissue]
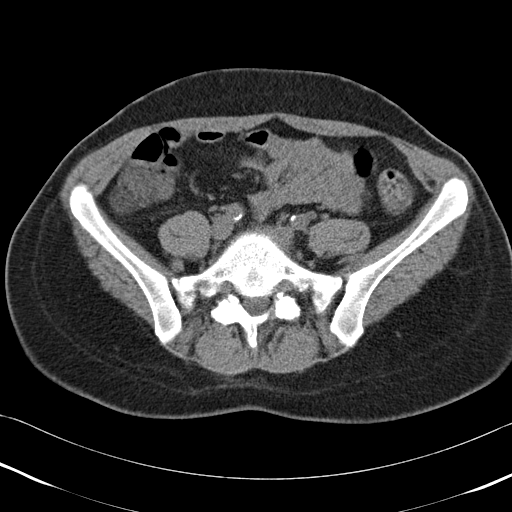
[im 50/99  soft-tissue]
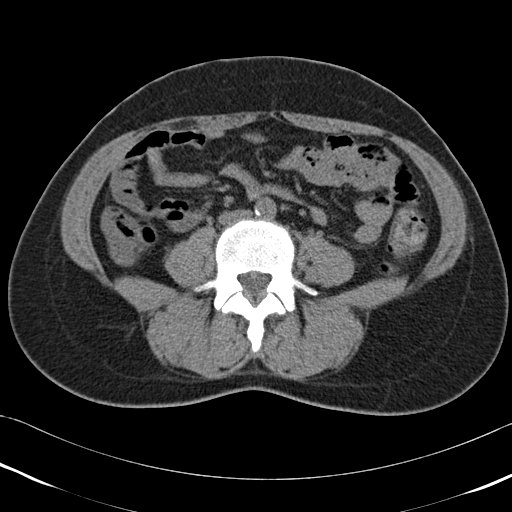
[im 58/99  soft-tissue]
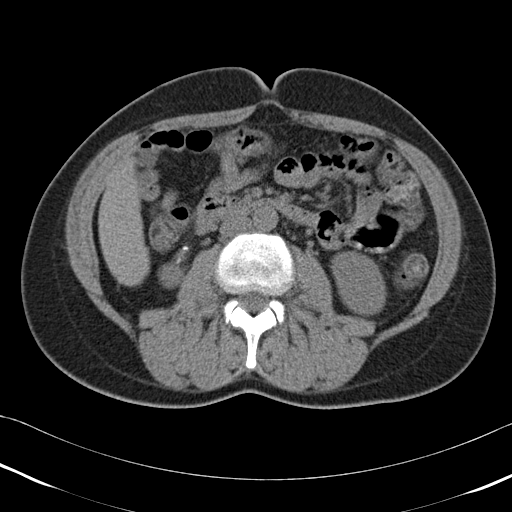
[im 66/99  soft-tissue]
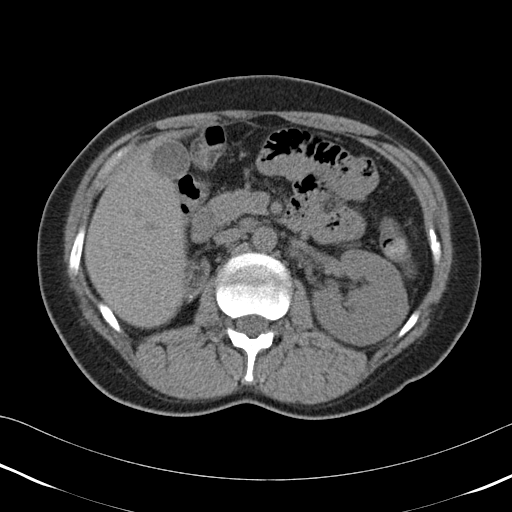
[im 66/99  bone]
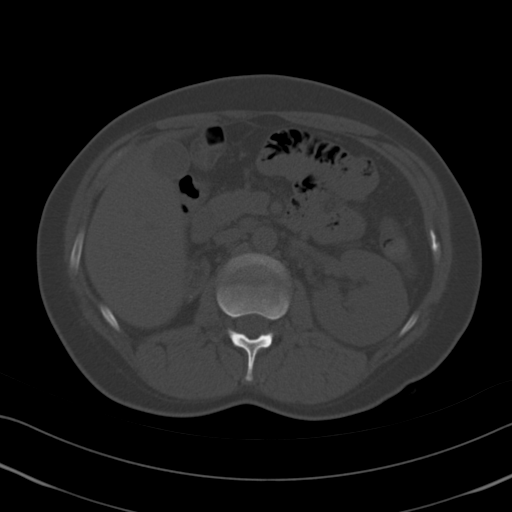
[im 70/99  soft-tissue]
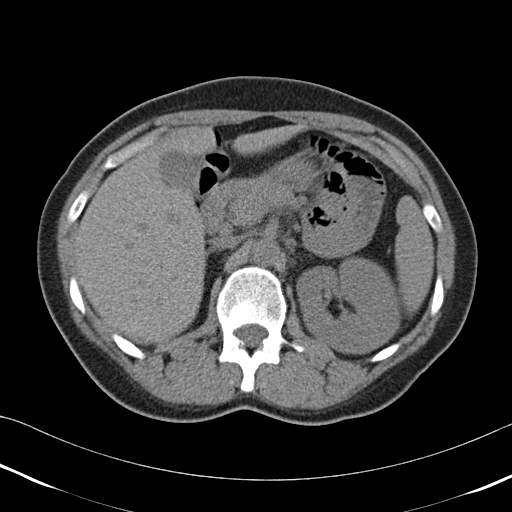
[im 78/99  soft-tissue]
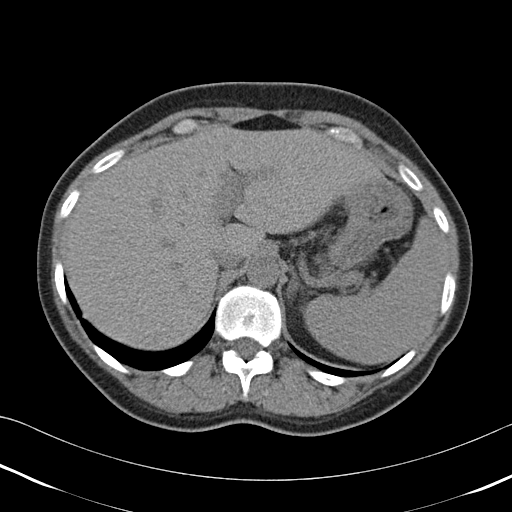
[im 86/99  soft-tissue]
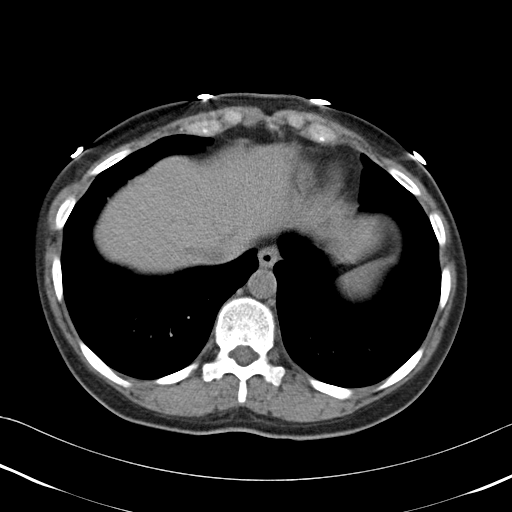
[im 94/99  soft-tissue]
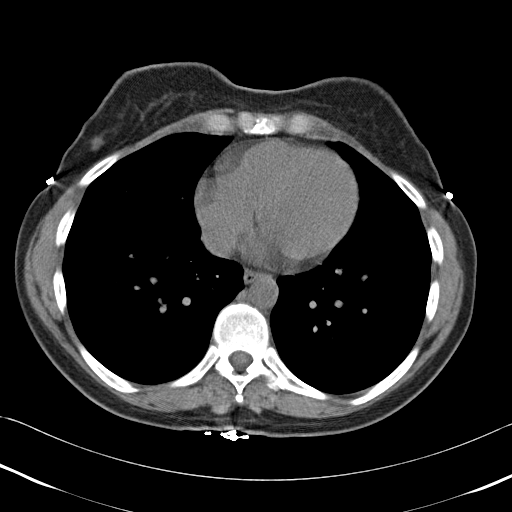

[Series 3: coronal · coronal · 0.63mm/px · 3 of 130 slices shown]
[im 44/130  soft-tissue]
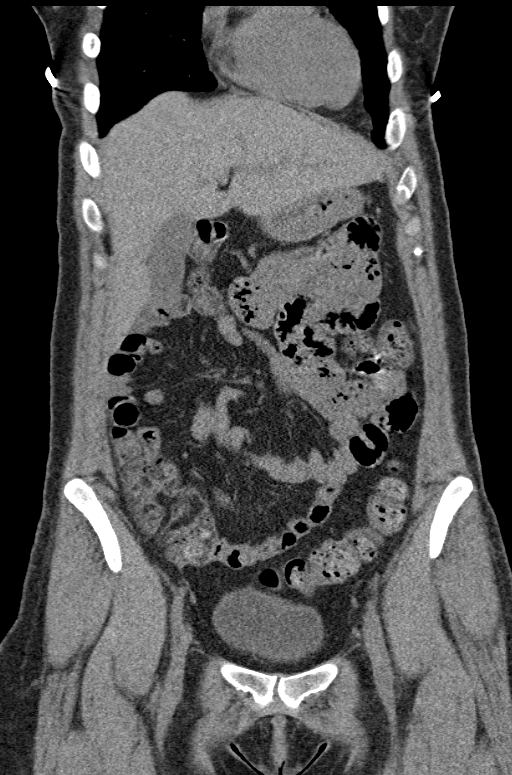
[im 58/130  soft-tissue]
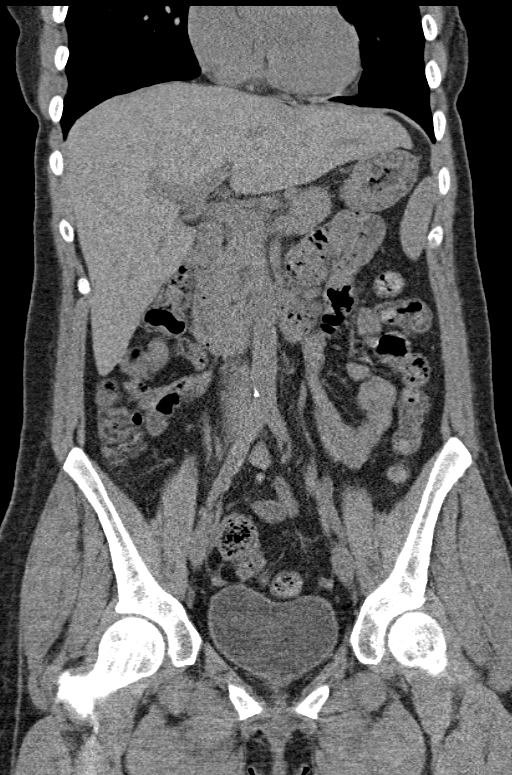
[im 72/130  soft-tissue]
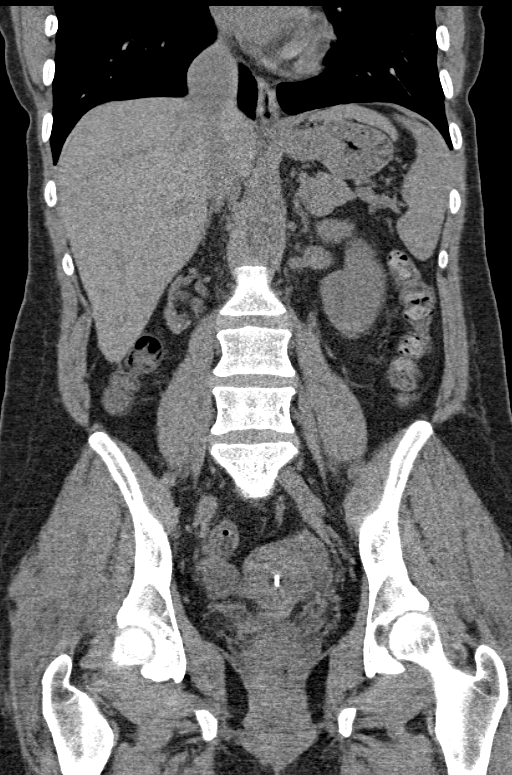

[16 of 46 positions shown; findings below may reference images not displayed]

FINDINGS: Visualized lung bases are unremarkable. No significant osseous
abnormality is noted.

No gallstones are noted. No focal abnormality is noted in the liver,
spleen and pancreas on these unenhanced images. Adrenal glands
appear normal. Stable severe right renal atrophy is noted. Left
kidney and ureter appear normal. No hydronephrosis or renal
obstruction is noted. No renal or ureteral calculi are noted. The
appendix appears normal. There is no evidence of bowel obstruction.
No abnormal fluid collection is noted. Intrauterine device is noted.
Ovaries appear normal. Urinary bladder appears normal. No
significant adenopathy is noted.
IMPRESSION: Stable severe right renal atrophy. Intrauterine device is noted. No
acute abnormality is noted in the abdomen or pelvis.

## 2016-12-23 ENCOUNTER — Emergency Department: Payer: Medicaid - Out of State

## 2016-12-23 ENCOUNTER — Encounter: Payer: Self-pay | Admitting: Emergency Medicine

## 2016-12-23 ENCOUNTER — Emergency Department
Admission: EM | Admit: 2016-12-23 | Discharge: 2016-12-23 | Disposition: A | Discharge status: Home / Self Care | Payer: Medicaid - Out of State | Attending: Emergency Medicine | Admitting: Emergency Medicine

## 2016-12-23 DIAGNOSIS — S39012A Strain of muscle, fascia and tendon of lower back, initial encounter: Secondary | ICD-10-CM | POA: Diagnosis not present

## 2016-12-23 DIAGNOSIS — Y998 Other external cause status: Secondary | ICD-10-CM | POA: Insufficient documentation

## 2016-12-23 DIAGNOSIS — F319 Bipolar disorder, unspecified: Secondary | ICD-10-CM | POA: Diagnosis not present

## 2016-12-23 DIAGNOSIS — Z23 Encounter for immunization: Secondary | ICD-10-CM | POA: Insufficient documentation

## 2016-12-23 DIAGNOSIS — Z79899 Other long term (current) drug therapy: Secondary | ICD-10-CM | POA: Insufficient documentation

## 2016-12-23 DIAGNOSIS — S7001XA Contusion of right hip, initial encounter: Secondary | ICD-10-CM | POA: Diagnosis not present

## 2016-12-23 DIAGNOSIS — S0101XA Laceration without foreign body of scalp, initial encounter: Secondary | ICD-10-CM | POA: Insufficient documentation

## 2016-12-23 DIAGNOSIS — S79911A Unspecified injury of right hip, initial encounter: Secondary | ICD-10-CM | POA: Diagnosis present

## 2016-12-23 DIAGNOSIS — Y9389 Activity, other specified: Secondary | ICD-10-CM | POA: Diagnosis not present

## 2016-12-23 DIAGNOSIS — S9002XA Contusion of left ankle, initial encounter: Secondary | ICD-10-CM | POA: Diagnosis not present

## 2016-12-23 DIAGNOSIS — Y92410 Unspecified street and highway as the place of occurrence of the external cause: Secondary | ICD-10-CM | POA: Diagnosis not present

## 2016-12-23 DIAGNOSIS — F17210 Nicotine dependence, cigarettes, uncomplicated: Secondary | ICD-10-CM | POA: Insufficient documentation

## 2016-12-23 LAB — URINALYSIS, COMPLETE (UACMP) WITH MICROSCOPIC
Bilirubin Urine: NEGATIVE
Glucose, UA: NEGATIVE mg/dL
Hgb urine dipstick: NEGATIVE
KETONES UR: NEGATIVE mg/dL
NITRITE: NEGATIVE
PH: 7 (ref 5.0–8.0)
Protein, ur: NEGATIVE mg/dL
SPECIFIC GRAVITY, URINE: 1.014 (ref 1.005–1.030)

## 2016-12-23 LAB — POCT PREGNANCY, URINE: PREG TEST UR: NEGATIVE

## 2016-12-23 MED ORDER — MORPHINE SULFATE (PF) 4 MG/ML IV SOLN
4.0000 mg | Freq: Once | INTRAVENOUS | Status: AC
  Administered 2016-12-23: 4 mg via INTRAVENOUS

## 2016-12-23 MED ORDER — IBUPROFEN 600 MG PO TABS: 600.0000 mg | ORAL_TABLET | Freq: Four times a day (QID) | ORAL | 0 refills | Status: AC | PRN

## 2016-12-23 MED ORDER — TETANUS-DIPHTH-ACELL PERTUSSIS 5-2.5-18.5 LF-MCG/0.5 IM SUSP
INTRAMUSCULAR | Status: AC
  Administered 2016-12-23: 0.5 mL via INTRAMUSCULAR
  Filled 2016-12-23: qty 0.5

## 2016-12-23 MED ORDER — SODIUM CHLORIDE 0.9 % IV SOLN
80.0000 mg | Freq: Once | INTRAVENOUS | Status: DC
  Filled 2016-12-23: qty 80

## 2016-12-23 MED ORDER — KETOROLAC TROMETHAMINE 30 MG/ML IJ SOLN
30.0000 mg | Freq: Once | INTRAMUSCULAR | Status: AC
  Administered 2016-12-23: 30 mg via INTRAVENOUS
  Filled 2016-12-23: qty 1

## 2016-12-23 MED ORDER — CEPHALEXIN 500 MG PO CAPS: 500.0000 mg | ORAL_CAPSULE | Freq: Two times a day (BID) | ORAL | 0 refills | Status: AC

## 2016-12-23 MED ORDER — LIDOCAINE HCL (PF) 1 % IJ SOLN
5.0000 mL | Freq: Once | INTRAMUSCULAR | Status: AC
  Administered 2016-12-23: 5 mL via INTRADERMAL
  Filled 2016-12-23: qty 5

## 2016-12-23 MED ORDER — TETANUS-DIPHTH-ACELL PERTUSSIS 5-2.5-18.5 LF-MCG/0.5 IM SUSP
0.5000 mL | Freq: Once | INTRAMUSCULAR | Status: AC
  Administered 2016-12-23: 0.5 mL via INTRAMUSCULAR

## 2016-12-23 MED ORDER — MORPHINE SULFATE (PF) 4 MG/ML IV SOLN
4.0000 mg | Freq: Once | INTRAVENOUS | Status: AC
  Administered 2016-12-23: 4 mg via INTRAVENOUS
  Filled 2016-12-23: qty 1

## 2016-12-23 MED ORDER — CEFTRIAXONE SODIUM IN DEXTROSE 20 MG/ML IV SOLN: 1.0000 g | Freq: Once | INTRAVENOUS | Status: DC

## 2016-12-23 MED ORDER — MORPHINE SULFATE (PF) 4 MG/ML IV SOLN
4.0000 mg | Freq: Once | INTRAVENOUS | Status: DC
  Filled 2016-12-23: qty 1

## 2016-12-23 NOTE — ED Notes (Signed)
Pt given graham crackers.
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
## 2016-12-23 NOTE — Discharge Instructions (Signed)
Return to the ER in 7 days for staple removal.  Follow-up with your regular doctor as needed.  Take the antibiotic as prescribed.  You should keep the ankle elevated and ice it to decrease swelling.  You may bear weight as tolerated.

## 2017-01-17 IMAGING — US US ART/VEN ABD/PELV/SCROTUM DOPPLER LTD
1 series · 13 of 25 positions shown · non-contrast
Comparison: Abdominal CT 06/27/2015

CLINICAL DATA: Right lower quadrant abdominal pain

EXAM:
TRANSABDOMINAL AND TRANSVAGINAL ULTRASOUND OF PELVIS
DOPPLER ULTRASOUND OF OVARIES
TECHNIQUE: Both transabdominal and transvaginal ultrasound examinations of the
pelvis were performed. Transabdominal technique was performed for
global imaging of the pelvis including uterus, ovaries, adnexal
regions, and pelvic cul-de-sac.
It was necessary to proceed with endovaginal exam following the
transabdominal exam to visualize the ovaries and IUD. Color and
duplex Doppler ultrasound was utilized to evaluate blood flow to the
ovaries.

[Series 1: us art/ven abd/pelv/scrotum doppler ltd · 0.19mm/px · 13 of 166 slices shown]
[im 1/166]
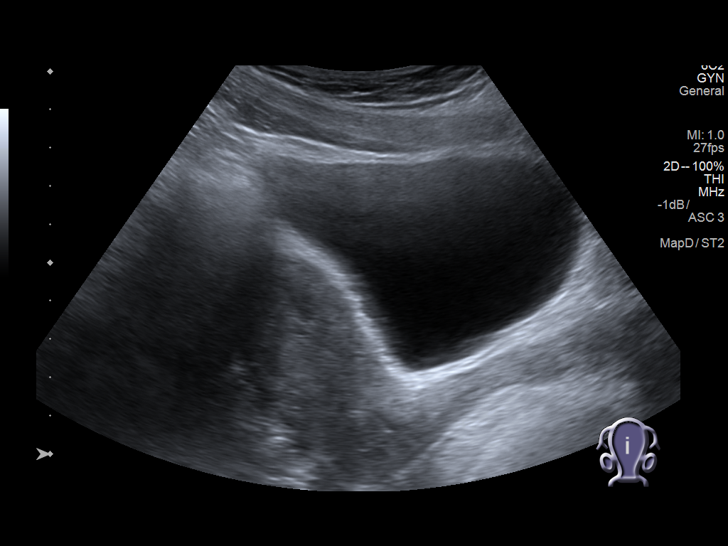
[im 14/166]
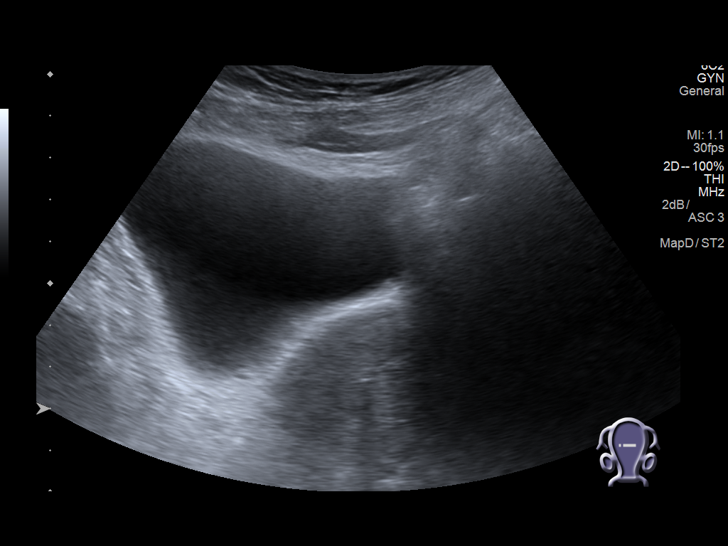
[im 28/166]
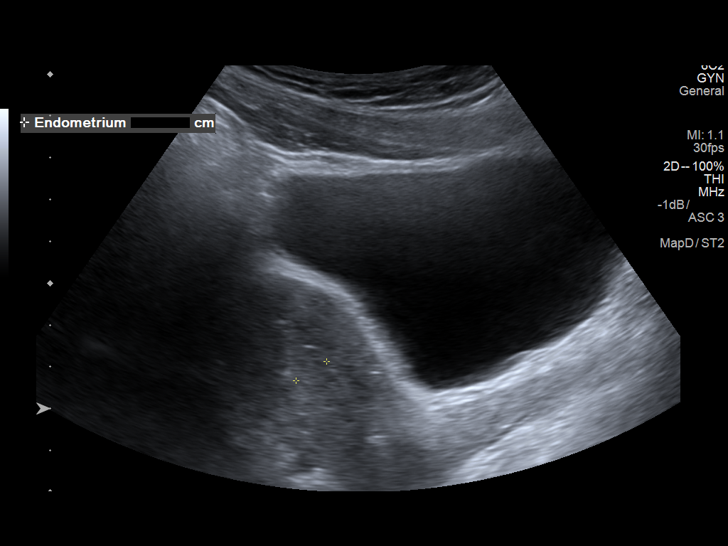
[im 42/166]
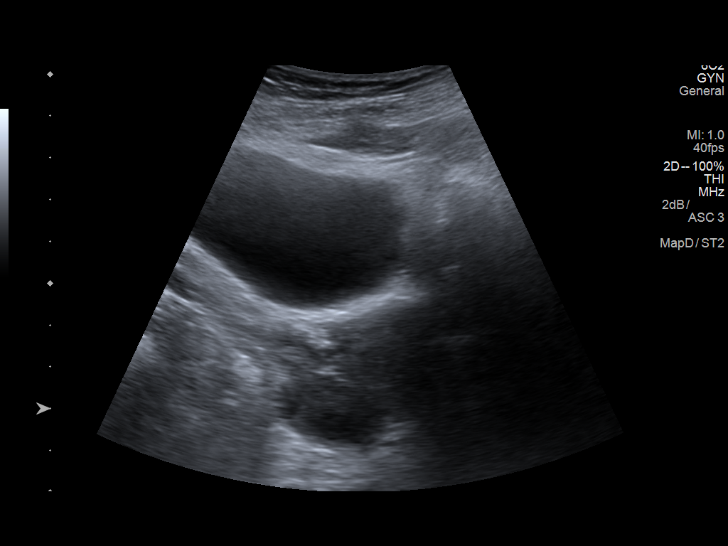
[im 56/166]
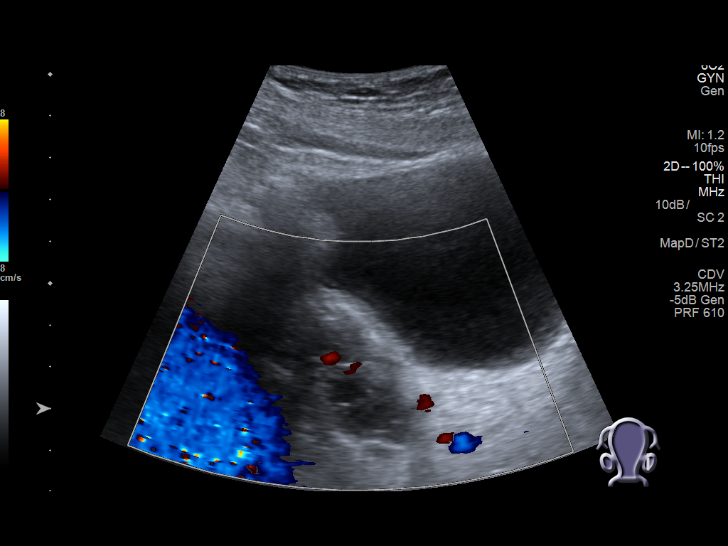
[im 69/166]
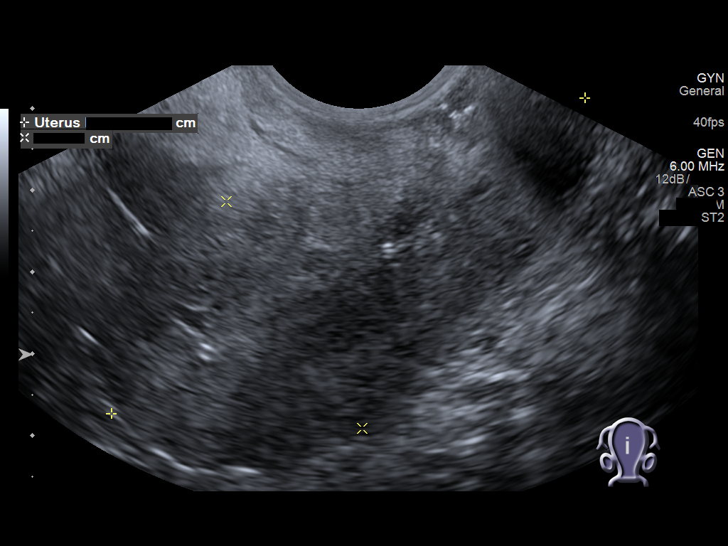
[im 83/166]
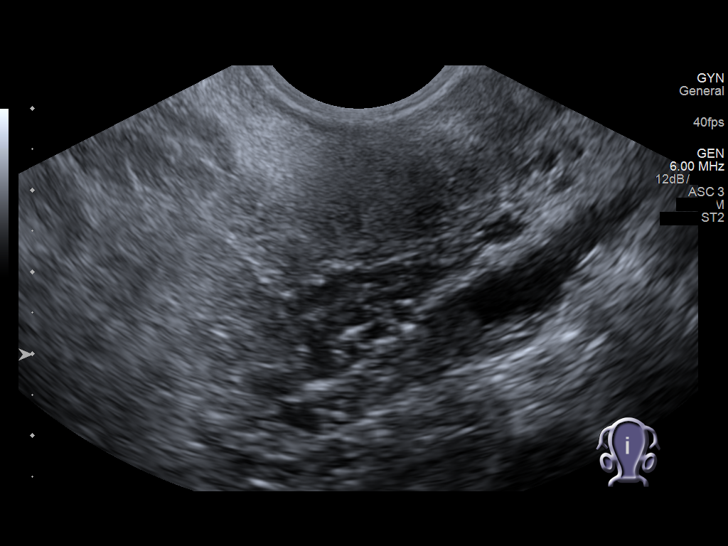
[im 97/166]
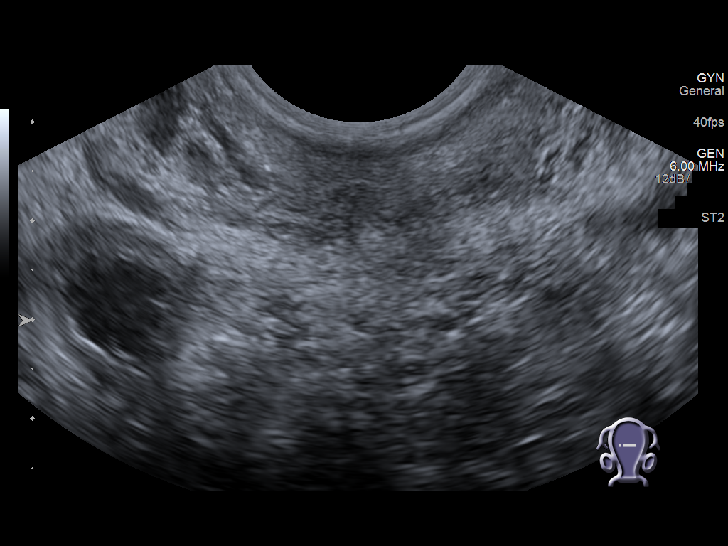
[im 111/166]
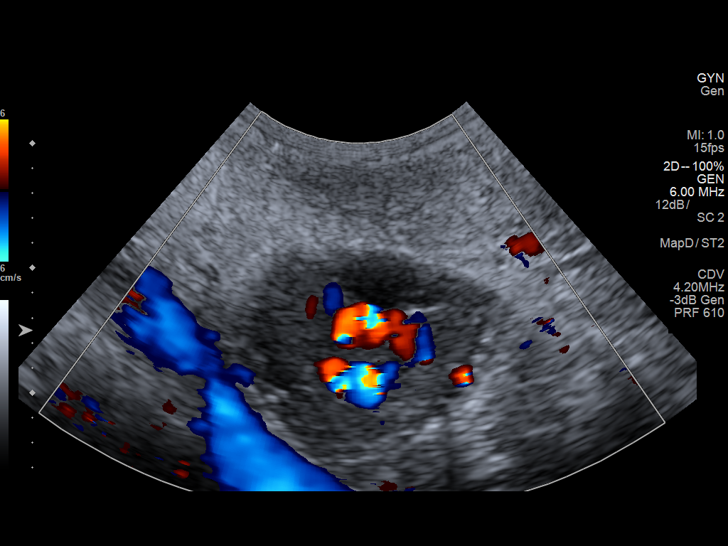
[im 124/166]
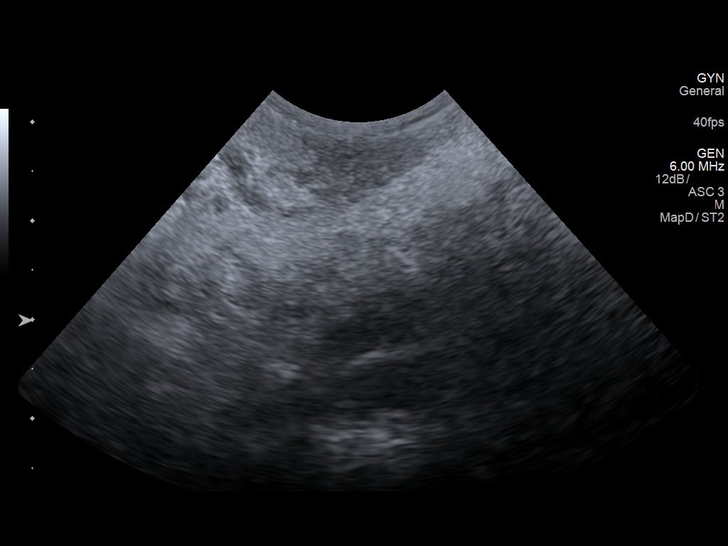
[im 138/166]
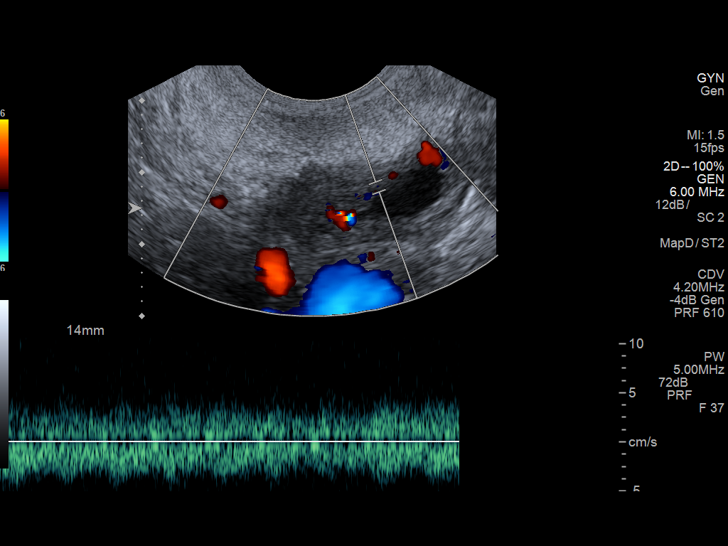
[im 152/166]
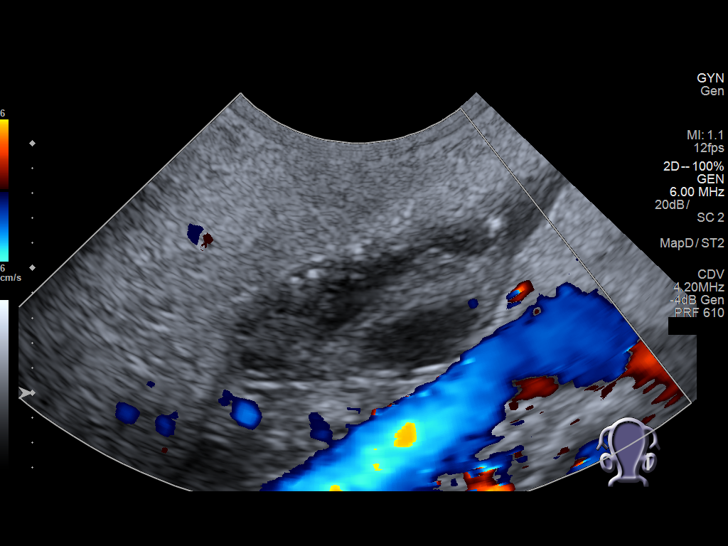
[im 166/166]
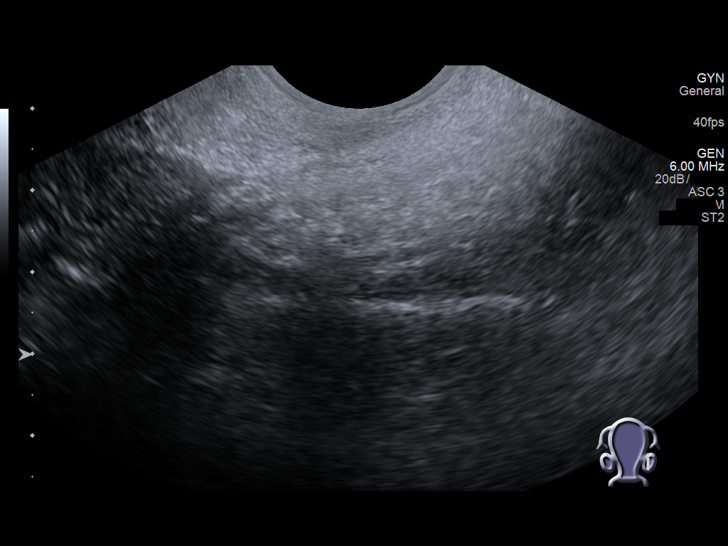

[13 of 25 positions shown; findings below may reference images not displayed]

FINDINGS: Uterus

Measurements: 7 x 3 x 5 cm. No fibroids or other mass visualized.

Endometrium

Thickness: 6 mm. IUD present and in unremarkable positioning. IUD
shadowing obscures posterior endometrium at multiple levels.

Right ovary

Measurements: 33 x 15 x 18 mm. Normal appearance/no adnexal mass.
Measured isoechoic structure is likely collapsing corpus luteum.

Left ovary

Measurements: 25 x 13 x 22 mm.. Normal appearance/no adnexal mass.

Pulsed Doppler evaluation of both ovaries demonstrates normal
low-resistance arterial and venous waveforms.

Other findings

No abnormal free fluid.
IMPRESSION: 1. Negative for ovarian torsion or other cause of pain.
2. IUD in good position.
